# Patient Record
Sex: Female | Born: 1970 | Race: White | Hispanic: No | Marital: Married | Smoking: Never smoker
Health system: Southern US, Community
[De-identification: ages and names within clinical notes are randomized; demographics above are authoritative.]

## PROBLEM LIST (undated history)

## (undated) DIAGNOSIS — E079 Disorder of thyroid, unspecified: Secondary | ICD-10-CM

## (undated) DIAGNOSIS — E119 Type 2 diabetes mellitus without complications: Secondary | ICD-10-CM

## (undated) DIAGNOSIS — I10 Essential (primary) hypertension: Secondary | ICD-10-CM

## (undated) HISTORY — PX: NO PAST SURGERIES: SHX2092

---

## 2005-03-23 ENCOUNTER — Emergency Department (HOSPITAL_COMMUNITY): Admission: EM | Admit: 2005-03-23 | Discharge: 2005-03-23 | Payer: Self-pay | Admitting: Emergency Medicine

## 2013-01-17 ENCOUNTER — Other Ambulatory Visit (HOSPITAL_COMMUNITY): Payer: Self-pay | Admitting: Nurse Practitioner

## 2013-01-17 DIAGNOSIS — Z139 Encounter for screening, unspecified: Secondary | ICD-10-CM

## 2013-01-31 ENCOUNTER — Ambulatory Visit (HOSPITAL_COMMUNITY): Payer: Self-pay

## 2013-02-07 ENCOUNTER — Ambulatory Visit (HOSPITAL_COMMUNITY)
Admission: RE | Admit: 2013-02-07 | Discharge: 2013-02-07 | Disposition: A | Discharge status: Home / Self Care | Payer: Self-pay | Source: Ambulatory Visit | Attending: Nurse Practitioner | Admitting: Nurse Practitioner

## 2013-02-07 DIAGNOSIS — Z139 Encounter for screening, unspecified: Secondary | ICD-10-CM

## 2014-03-10 ENCOUNTER — Other Ambulatory Visit (HOSPITAL_COMMUNITY): Payer: Self-pay | Admitting: *Deleted

## 2014-03-10 DIAGNOSIS — Z1231 Encounter for screening mammogram for malignant neoplasm of breast: Secondary | ICD-10-CM

## 2014-04-17 ENCOUNTER — Ambulatory Visit (HOSPITAL_COMMUNITY): Payer: Self-pay

## 2014-05-01 ENCOUNTER — Ambulatory Visit (HOSPITAL_COMMUNITY)
Admission: RE | Admit: 2014-05-01 | Discharge: 2014-05-01 | Disposition: A | Discharge status: Home / Self Care | Payer: Self-pay | Source: Ambulatory Visit | Attending: *Deleted | Admitting: *Deleted

## 2014-05-01 DIAGNOSIS — Z1231 Encounter for screening mammogram for malignant neoplasm of breast: Secondary | ICD-10-CM

## 2015-03-28 ENCOUNTER — Other Ambulatory Visit (HOSPITAL_COMMUNITY): Payer: Self-pay | Admitting: *Deleted

## 2015-03-28 DIAGNOSIS — Z1231 Encounter for screening mammogram for malignant neoplasm of breast: Secondary | ICD-10-CM

## 2015-05-07 ENCOUNTER — Ambulatory Visit (HOSPITAL_COMMUNITY)
Admission: RE | Admit: 2015-05-07 | Discharge: 2015-05-07 | Disposition: A | Discharge status: Home / Self Care | Payer: BLUE CROSS/BLUE SHIELD | Source: Ambulatory Visit | Attending: *Deleted | Admitting: *Deleted

## 2015-05-07 DIAGNOSIS — Z1231 Encounter for screening mammogram for malignant neoplasm of breast: Secondary | ICD-10-CM

## 2016-04-02 ENCOUNTER — Other Ambulatory Visit (HOSPITAL_COMMUNITY): Payer: Self-pay | Admitting: Family

## 2016-04-02 DIAGNOSIS — Z1231 Encounter for screening mammogram for malignant neoplasm of breast: Secondary | ICD-10-CM

## 2016-05-12 ENCOUNTER — Ambulatory Visit (HOSPITAL_COMMUNITY)
Admission: RE | Admit: 2016-05-12 | Discharge: 2016-05-12 | Disposition: A | Discharge status: Home / Self Care | Payer: BLUE CROSS/BLUE SHIELD | Source: Ambulatory Visit | Attending: Family | Admitting: Family

## 2016-05-12 DIAGNOSIS — Z1231 Encounter for screening mammogram for malignant neoplasm of breast: Secondary | ICD-10-CM | POA: Diagnosis present

## 2017-04-01 ENCOUNTER — Other Ambulatory Visit (HOSPITAL_COMMUNITY): Payer: Self-pay | Admitting: *Deleted

## 2017-04-01 DIAGNOSIS — Z1231 Encounter for screening mammogram for malignant neoplasm of breast: Secondary | ICD-10-CM

## 2017-05-18 ENCOUNTER — Ambulatory Visit (HOSPITAL_COMMUNITY)
Admission: RE | Admit: 2017-05-18 | Discharge: 2017-05-18 | Disposition: A | Discharge status: Home / Self Care | Payer: BLUE CROSS/BLUE SHIELD | Source: Ambulatory Visit | Attending: *Deleted | Admitting: *Deleted

## 2017-05-18 DIAGNOSIS — Z1231 Encounter for screening mammogram for malignant neoplasm of breast: Secondary | ICD-10-CM | POA: Insufficient documentation

## 2018-04-08 ENCOUNTER — Other Ambulatory Visit (HOSPITAL_COMMUNITY): Payer: Self-pay | Admitting: *Deleted

## 2018-04-08 DIAGNOSIS — Z1231 Encounter for screening mammogram for malignant neoplasm of breast: Secondary | ICD-10-CM

## 2018-05-24 ENCOUNTER — Ambulatory Visit (HOSPITAL_COMMUNITY): Payer: BLUE CROSS/BLUE SHIELD

## 2018-05-31 ENCOUNTER — Ambulatory Visit (HOSPITAL_COMMUNITY)
Admission: RE | Admit: 2018-05-31 | Discharge: 2018-05-31 | Disposition: A | Discharge status: Home / Self Care | Payer: BLUE CROSS/BLUE SHIELD | Source: Ambulatory Visit | Attending: *Deleted | Admitting: *Deleted

## 2018-05-31 DIAGNOSIS — Z1231 Encounter for screening mammogram for malignant neoplasm of breast: Secondary | ICD-10-CM | POA: Diagnosis present

## 2019-04-21 ENCOUNTER — Other Ambulatory Visit (HOSPITAL_COMMUNITY): Payer: Self-pay | Admitting: *Deleted

## 2019-04-21 DIAGNOSIS — Z1231 Encounter for screening mammogram for malignant neoplasm of breast: Secondary | ICD-10-CM

## 2019-06-06 ENCOUNTER — Ambulatory Visit (HOSPITAL_COMMUNITY): Payer: BLUE CROSS/BLUE SHIELD

## 2021-02-13 ENCOUNTER — Other Ambulatory Visit: Payer: Self-pay

## 2021-02-13 DIAGNOSIS — Y9389 Activity, other specified: Secondary | ICD-10-CM | POA: Insufficient documentation

## 2021-02-13 DIAGNOSIS — W010XXA Fall on same level from slipping, tripping and stumbling without subsequent striking against object, initial encounter: Secondary | ICD-10-CM | POA: Insufficient documentation

## 2021-02-13 DIAGNOSIS — Y9289 Other specified places as the place of occurrence of the external cause: Secondary | ICD-10-CM | POA: Diagnosis not present

## 2021-02-13 DIAGNOSIS — Y999 Unspecified external cause status: Secondary | ICD-10-CM | POA: Diagnosis not present

## 2021-02-13 DIAGNOSIS — S8261XA Displaced fracture of lateral malleolus of right fibula, initial encounter for closed fracture: Secondary | ICD-10-CM | POA: Diagnosis not present

## 2021-02-13 DIAGNOSIS — Z7984 Long term (current) use of oral hypoglycemic drugs: Secondary | ICD-10-CM | POA: Diagnosis not present

## 2021-02-13 DIAGNOSIS — I10 Essential (primary) hypertension: Secondary | ICD-10-CM | POA: Insufficient documentation

## 2021-02-13 DIAGNOSIS — E119 Type 2 diabetes mellitus without complications: Secondary | ICD-10-CM | POA: Insufficient documentation

## 2021-02-13 DIAGNOSIS — S99911A Unspecified injury of right ankle, initial encounter: Secondary | ICD-10-CM | POA: Diagnosis present

## 2021-02-14 ENCOUNTER — Encounter (HOSPITAL_COMMUNITY): Payer: Self-pay | Admitting: Emergency Medicine

## 2021-02-14 ENCOUNTER — Emergency Department (HOSPITAL_COMMUNITY)
Admission: EM | Admit: 2021-02-14 | Discharge: 2021-02-14 | Disposition: A | Discharge status: Home / Self Care | Payer: BLUE CROSS/BLUE SHIELD | Attending: Emergency Medicine | Admitting: Emergency Medicine

## 2021-02-14 ENCOUNTER — Emergency Department (HOSPITAL_COMMUNITY): Payer: BLUE CROSS/BLUE SHIELD

## 2021-02-14 ENCOUNTER — Other Ambulatory Visit: Payer: Self-pay

## 2021-02-14 DIAGNOSIS — T148XXA Other injury of unspecified body region, initial encounter: Secondary | ICD-10-CM

## 2021-02-14 DIAGNOSIS — S82831A Other fracture of upper and lower end of right fibula, initial encounter for closed fracture: Secondary | ICD-10-CM

## 2021-02-14 HISTORY — DX: Disorder of thyroid, unspecified: E07.9

## 2021-02-14 HISTORY — DX: Type 2 diabetes mellitus without complications: E11.9

## 2021-02-14 HISTORY — DX: Essential (primary) hypertension: I10

## 2021-02-14 MED ORDER — KETAMINE HCL 10 MG/ML IJ SOLN
0.5000 mg/kg | Freq: Once | INTRAMUSCULAR | Status: AC
  Administered 2021-02-14: 55 mg via INTRAVENOUS
  Filled 2021-02-14: qty 1

## 2021-02-14 MED ORDER — HYDROCODONE-ACETAMINOPHEN 5-325 MG PO TABS: 1.0000 | ORAL_TABLET | ORAL | 0 refills | Status: DC | PRN

## 2021-02-14 MED ORDER — PROPOFOL 10 MG/ML IV BOLUS
0.5000 mg/kg | Freq: Once | INTRAVENOUS | Status: AC
  Administered 2021-02-14: 54.7 mg via INTRAVENOUS
  Filled 2021-02-14: qty 20

## 2021-02-14 NOTE — ED Triage Notes (Signed)
Pt fell while walking dog and now c/o right ankle/lower leg pain.

## 2021-02-14 NOTE — ED Provider Notes (Signed)
[facility] EMERGENCY DEPARTMENT Provider Note   CSN: [id number] Arrival date & time: 02/13/21  2334     History Chief Complaint  Patient presents with  . Ankle Pain    Tara Santiago is a 50 y.o. female.  Patient presents to the emergency department for evaluation of ankle injury.  Patient reports that she slipped in the mud and twisted her ankle, causing her to fall.  Patient complaining of severe left ankle pain since the fall, cannot bear any weight.  No other injury.        Past Medical History:  Diagnosis Date  . Diabetes mellitus without complication ([place])   . Hypertension   . Thyroid disease     There are no problems to display for this patient.   History reviewed. No pertinent surgical history.   OB History   No obstetric history on file.     No family history on file.  Social History   Tobacco Use  . Smoking status: Never Smoker  . Smokeless tobacco: Never Used  Substance Use Topics  . Alcohol use: Never  . Drug use: Never    Home Medications Prior to Admission medications   Not on File    Allergies    Peanut-containing drug products  Review of Systems   Review of Systems  Musculoskeletal: Positive for arthralgias.  Skin: Positive for color change.    Physical Exam Updated Vital Signs BP (!) 147/84   Pulse 96   Temp 98.8 F (37.1 C)   Resp 20   Ht 5' 7"  (1.702 m)   Wt 109.3 kg   LMP 01/15/2021   SpO2 100%   BMI 37.75 kg/m   Physical Exam Vitals and nursing note reviewed.  Constitutional:      Appearance: Normal appearance.  HENT:     Head: Atraumatic.  Musculoskeletal:     Left ankle: Swelling and ecchymosis present. No lacerations. Tenderness present over the medial malleolus. No proximal fibula tenderness.     Left foot: No swelling or deformity.  Skin:    Findings: Bruising present.  Neurological:     Mental Status: She is alert.     Cranial Nerves: Cranial nerves are intact.     Sensory: Sensation is intact.      Motor: Motor function is intact.     ED Results / Procedures / Treatments   Labs (all labs ordered are listed, but only abnormal results are displayed) Labs Reviewed - No data to display  EKG None  Radiology DG Tibia/Fibula Right  Result Date: 02/14/2021 CLINICAL DATA:  [place], right lower leg pain EXAM: RIGHT TIBIA AND FIBULA - 2 VIEW; RIGHT ANKLE - COMPLETE 3+ VIEW COMPARISON:  None. FINDINGS: Right tibia/fibula: Frontal and lateral views are obtained. There is a displaced oblique fracture through the distal fibular diaphysis. The remainder of the right tibia and fibula are unremarkable. There is lateral dislocation at the tibiotalar joint. Right knee is well aligned, with moderate medial and lateral compartmental osteoarthritis. Diffuse soft tissue edema. Right ankle: Frontal, oblique, lateral views demonstrate a displaced oblique distal fibular fracture. On the oblique projection, there is a small ossific density medial to the talus, likely a small avulsion fracture from the medial malleolus or medial aspect of the talus. There is lateral dislocation of the tibiotalar joint. Diffuse soft tissue edema. IMPRESSION: 1. Oblique distal fibular fracture. 2. Small avulsion fracture off the medial malleolus or medial margin of the talus. 3. Lateral dislocation of the tibiotalar joint.  4. Diffuse soft tissue edema right lower leg. Electronically Signed   By: Randa Ngo M.D.   On: 02/14/2021 01:07   DG Ankle Complete Right  Result Date: 02/14/2021 CLINICAL DATA:  [place], right lower leg pain EXAM: RIGHT TIBIA AND FIBULA - 2 VIEW; RIGHT ANKLE - COMPLETE 3+ VIEW COMPARISON:  None. FINDINGS: Right tibia/fibula: Frontal and lateral views are obtained. There is a displaced oblique fracture through the distal fibular diaphysis. The remainder of the right tibia and fibula are unremarkable. There is lateral dislocation at the tibiotalar joint. Right knee is well aligned, with moderate medial and lateral  compartmental osteoarthritis. Diffuse soft tissue edema. Right ankle: Frontal, oblique, lateral views demonstrate a displaced oblique distal fibular fracture. On the oblique projection, there is a small ossific density medial to the talus, likely a small avulsion fracture from the medial malleolus or medial aspect of the talus. There is lateral dislocation of the tibiotalar joint. Diffuse soft tissue edema. IMPRESSION: 1. Oblique distal fibular fracture. 2. Small avulsion fracture off the medial malleolus or medial margin of the talus. 3. Lateral dislocation of the tibiotalar joint. 4. Diffuse soft tissue edema right lower leg. Electronically Signed   By: Randa Ngo M.D.   On: 02/14/2021 01:07    Procedures .Ortho Injury Treatment  Date/Time: 02/14/2021 1:48 AM Performed by: Orpah Greek, MD Authorized by: Orpah Greek, MD   Consent:    Consent obtained:  Written   Consent given by:  Patient   Risks discussed:  Nerve damage, restricted joint movement, recurrent dislocation and irreducible dislocation Universal protocol:    Procedure explained and questions answered to patient or proxy's satisfaction: yes     Relevant documents present and verified: yes     Test results available and properly labeled: yes     Imaging studies available: yes     Required blood products, implants, devices, and special equipment available: yes     Site/side marked: yes     Immediately prior to procedure a time out was called: yes     Patient identity confirmed:  Verbally with patientInjury location: ankle Injury type: fracture-dislocation Fracture type: lateral malleolus Pre-procedure neurovascular assessment: neurovascularly intact Pre-procedure distal perfusion: normal Pre-procedure neurological function: normal Pre-procedure range of motion: reduced  Anesthesia: Local anesthesia used: no  Patient sedated: Yes. Refer to sedation procedure documentation for details of  sedation. Manipulation performed: yes Skeletal traction used: yes Reduction successful: yes X-ray confirmed reduction: yes Immobilization: splint Splint type: short leg and ankle stirrup Splint Applied by: ED Provider Supplies used: Ortho-Glass Post-procedure neurovascular assessment: post-procedure neurovascularly intact Post-procedure distal perfusion: normal Post-procedure neurological function: normal Post-procedure range of motion: unchanged  .Sedation  Date/Time: 02/14/2021 1:49 AM Performed by: Orpah Greek, MD Authorized by: Orpah Greek, MD   Consent:    Consent obtained:  Written   Consent given by:  Patient   Risks discussed:  Prolonged hypoxia resulting in organ damage, inadequate sedation, respiratory compromise necessitating ventilatory assistance and intubation, nausea and vomiting Universal protocol:    Procedure explained and questions answered to patient or proxy's satisfaction: yes     Relevant documents present and verified: yes     Test results available: yes     Imaging studies available: yes     Required blood products, implants, devices, and special equipment available: yes     Site/side marked: yes     Immediately prior to procedure, a time out was called: yes     Patient  identity confirmed:  Verbally with patient Indications:    Procedure performed:  Fracture reduction   Procedure necessitating sedation performed by:  Physician performing sedation Pre-sedation assessment:    Time since last food or drink:  8   ASA classification: class 2 - patient with mild systemic disease     Mouth opening:  3 or more finger widths   Mallampati score:  I - soft palate, uvula, fauces, pillars visible   Neck mobility: normal     Pre-sedation assessments completed and reviewed: airway patency, cardiovascular function, hydration status, mental status, nausea/vomiting, pain level, respiratory function and temperature   Immediate pre-procedure  details:    Reassessment: Patient reassessed immediately prior to procedure     Reviewed: vital signs and NPO status     Verified: bag valve mask available, emergency equipment available, intubation equipment available, IV patency confirmed and oxygen available   Procedure details (see MAR for exact dosages):    Preoxygenation:  Nasal cannula   Sedation:  Ketamine and propofol   Intended level of sedation: deep   Intra-procedure monitoring:  Blood pressure monitoring, continuous pulse oximetry, frequent LOC assessments, frequent vital sign checks and cardiac monitor   Intra-procedure events: none     Total Provider sedation time (minutes):  15 Post-procedure details:    Attendance: Constant attendance by certified staff until patient recovered     Recovery: Patient returned to pre-procedure baseline     Post-sedation assessments completed and reviewed: airway patency, cardiovascular function, hydration status, mental status, nausea/vomiting, pain level and respiratory function     Procedure completion:  Tolerated well, no immediate complications      SPLINT APPLICATION Date/Time: 7:11 AM Authorized by: Orpah Greek Consent: Verbal consent obtained. Risks and benefits: risks, benefits and alternatives were discussed Consent given by: patient Splint applied by: myself Location details: right leg  Splint type: short leg, stirrup Supplies used: orthoglass Post-procedure: The splinted body part was neurovascularly unchanged following the procedure. Patient tolerance: Patient tolerated the procedure well with no immediate complications.    Medications Ordered in ED Medications  propofol (DIPRIVAN) 10 mg/mL bolus/IV push 54.7 mg (54.7 mg Intravenous Given 02/14/21 0141)  ketamine (KETALAR) injection 55 mg (55 mg Intravenous Given 02/14/21 0140)    ED Course  I have reviewed the triage vital signs and the nursing notes.  Pertinent labs & imaging results that were available  during my care of the patient were reviewed by me and considered in my medical decision making (see chart for details).    MDM Rules/Calculators/A&P                          Patient presents with isolated ankle injury after slipping in mud and falling, twisting her ankle.  X-ray shows distal fibula fracture with lateral dislocation of the talus.  Findings discussed with patient, recommended closed reduction immediately in the emergency department which she did consent to.  This was performed.  Patient in a splint, will be nonweightbearing status.  Follow-up with orthopedics for surgical repair.  Final Clinical Impression(s) / ED Diagnoses Final diagnoses:  Fracture  Closed fracture of distal end of right fibula, unspecified fracture morphology, initial encounter    Rx / DC Orders ED Discharge Orders    None       Orpah Greek, MD 02/14/21 [phone]

## 2021-02-14 NOTE — ED Notes (Signed)
Patient transported to X-ray 

## 2021-02-14 NOTE — ED Notes (Signed)
Pt is awake and alert x4. Will continue to monitor pt

## 2021-02-14 NOTE — Sedation Documentation (Addendum)
Time out at [place]

## 2021-02-15 ENCOUNTER — Telehealth: Payer: Self-pay | Admitting: Orthopedic Surgery

## 2021-02-15 ENCOUNTER — Ambulatory Visit: Payer: BLUE CROSS/BLUE SHIELD | Admitting: Orthopedic Surgery

## 2021-02-15 ENCOUNTER — Encounter: Payer: Self-pay | Admitting: Orthopedic Surgery

## 2021-02-15 VITALS — BP 132/92 | HR 100 | Ht 67.0 in | Wt 241.0 lb

## 2021-02-15 DIAGNOSIS — Y93K1 Activity, walking an animal: Secondary | ICD-10-CM

## 2021-02-15 DIAGNOSIS — W19XXXA Unspecified fall, initial encounter: Secondary | ICD-10-CM | POA: Diagnosis not present

## 2021-02-15 DIAGNOSIS — S82891A Other fracture of right lower leg, initial encounter for closed fracture: Secondary | ICD-10-CM | POA: Diagnosis not present

## 2021-02-15 NOTE — H&P (View-Only) (Signed)
New Patient Visit  Assessment: Tara Santiago is a 50 y.o. female with the following: Right ankle fracture dislocation  Plan: Tara Santiago sustained a right ankle fracture, bimalleolar equivalent with distal fibula fracture and lateral displacement of the talus, indicative of deltoid ligament disruption.  She will benefit from operative fixation to include ORIF of the distal fibula fracture with intraoperative assessment of the syndesmosis.  We may also proceed with a tightrope device for the syndesmosis.  Risks and benefits of surgery, including, but not limited to infection, bleeding, persistent pain, damage to surrounding structures, need for further surgery, malunion, nonunion and more severe complications associated with anesthesia were discussed.  All questions have been answered and they have elected to proceed with surgery.   In preparation for surgery, I have advised her to elevate the operative extremity as much as possible in order to improve swelling.  There is a small chance that the swelling might be too severe at the time of the planned procedure.  If so, the ankle will be reduced in the OR and another splint will be placed.  She stated her understanding.  She has diabetes, which is well controlled, per her report.  She denies neuropathy prior to her injury.  She is aware that she is at an increased risk of delayed healing.  We may resort to prolonged immobilization as a result.   Surgical Plan  Procedure:  Operative fixation of right ankle fracture  Disposition: Ambulatory Anesthesia: Block + sedation; may need paralysis Medical Comorbidities: Diabetes (medications + insulin, well controlled), obesity, HTN Notes: Lateral plate and screws, +/- tightrope device   Follow-up: Return for After surgery; DOS 3/24.  Subjective:  Chief Complaint  Patient presents with  . Ankle Pain    Right ankle.     History of Present Illness: Tara Santiago is a 50 y.o. female who  presents for evaluation of right ankle pain.  She was walking her dog 2 days ago when she fell and twisted her ankle in the mud.  She noted immediate pain and deformity in her ankle.  She presented to the ED where she was diagnosed with an ankle fracture that was closed reduced under conscious sedation.  Since then, she has done well.  Her pain is controlled.  She has been trying to keep her leg elevated.  She has been taking hydrocodone for pain.  She struggles with crutches but her husband has a walker that she can use.  No numbness or tingling.  She has occasional sharp pains, she reports this is more like a burning sensation.  No pain elsewhere.    Review of Systems: No fevers or chills No numbness or tingling No chest pain No shortness of breath No bowel or bladder dysfunction No GI distress No headaches   Medical History:  Past Medical History:  Diagnosis Date  . Diabetes mellitus without complication ([place])   . Hypertension   . Thyroid disease     History reviewed. No pertinent surgical history.  History reviewed. No pertinent family history. Social History   Tobacco Use  . Smoking status: Never Smoker  . Smokeless tobacco: Never Used  Substance Use Topics  . Alcohol use: Never  . Drug use: Never    Allergies  Allergen Reactions  . Peanut-Containing Drug Products Hives    Patient reports dizzy and passes out.    Current Meds  Medication Sig  . APPLE CIDER VINEGAR PO Take 1 tablet by mouth daily.  Marland Kitchen aspirin  81 MG chewable tablet Chew 81 mg by mouth daily.  . Cholecalciferol (VITAMIN D) 125 MCG (5000 UT) CAPS Take 1 tablet by mouth daily.  Marland Kitchen glipiZIDE (GLUCOTROL) 10 MG tablet Take 20 mg by mouth 2 (two) times daily.  Marland Kitchen HYDROcodone-acetaminophen (NORCO/VICODIN) 5-325 MG tablet Take 1-2 tablets by mouth every 4 (four) hours as needed.  Marland Kitchen levothyroxine (SYNTHROID) 25 MCG tablet Take 25 mcg by mouth daily.  Marland Kitchen lisinopril (ZESTRIL) 2.5 MG tablet Take 2.5 mg by mouth  daily.  . metFORMIN (GLUCOPHAGE) 1000 MG tablet Take 1,000 mg by mouth 2 (two) times daily.  . methimazole (TAPAZOLE) 5 MG tablet Take 5 mg by mouth 2 (two) times daily.  . metoprolol tartrate (LOPRESSOR) 50 MG tablet Take 50 mg by mouth 2 (two) times daily.  . norethindrone (MICRONOR) 0.35 MG tablet Take 1 tablet by mouth daily.  Marland Kitchen NOVOLIN 70/30 RELION (70-30) 100 UNIT/ML injection SMARTSIG:35 Unit(s) SUB-Q Twice Daily  . oxybutynin (DITROPAN-XL) 10 MG 24 hr tablet Take 10 mg by mouth daily.    Objective: BP (!) 132/92   Pulse 100   Ht 5' 7"  (1.702 m)   Wt 241 lb (109.3 kg) Comment: Per patient  BMI 37.75 kg/m   Physical Exam:  General:  Alert and oriented, no acute distress.  Obese female, seated in a wheelchair.  Gait:  Unable to ambulate.  Right ankle in a short leg splint.  Splint is clean, dry and intact.  Sensation intact to exposed toes.  Able to wiggle toes.  Good capillary refill.  No skin breakdown around the splint.     IMAGING: I personally reviewed images previously obtained from the ED   Right ankle with distal fibula fracture and lateral displacement of the talus.  Interval reduction with improved alignment but persistent lateral displacement of the talus.    New Medications:  No orders of the defined types were placed in this encounter.     Mordecai Rasmussen, MD  02/15/2021 10:27 AM

## 2021-02-15 NOTE — Telephone Encounter (Signed)
I am waiting on the authorization for the surgery approval.

## 2021-02-15 NOTE — Progress Notes (Signed)
New Patient Visit  Assessment: Tara Santiago is a 50 y.o. female with the following: Right ankle fracture dislocation  Plan: [name] sustained a right ankle fracture, bimalleolar equivalent with distal fibula fracture and lateral displacement of the talus, indicative of deltoid ligament disruption.  She will benefit from operative fixation to include ORIF of the distal fibula fracture with intraoperative assessment of the syndesmosis.  We may also proceed with a tightrope device for the syndesmosis.  Risks and benefits of surgery, including, but not limited to infection, bleeding, persistent pain, damage to surrounding structures, need for further surgery, malunion, nonunion and more severe complications associated with anesthesia were discussed.  All questions have been answered and they have elected to proceed with surgery.   In preparation for surgery, I have advised her to elevate the operative extremity as much as possible in order to improve swelling.  There is a small chance that the swelling might be too severe at the time of the planned procedure.  If so, the ankle will be reduced in the OR and another splint will be placed.  She stated her understanding.  She has diabetes, which is well controlled, per her report.  She denies neuropathy prior to her injury.  She is aware that she is at an increased risk of delayed healing.  We may resort to prolonged immobilization as a result.   Surgical Plan  Procedure:  Operative fixation of right ankle fracture  Disposition: Ambulatory Anesthesia: Block + sedation; may need paralysis Medical Comorbidities: Diabetes (medications + insulin, well controlled), obesity, HTN Notes: Lateral plate and screws, +/- tightrope device   Follow-up: Return for After surgery; DOS 3/24.  Subjective:  Chief Complaint  Patient presents with  . Ankle Pain    Right ankle.     History of Present Illness: Tara Santiago is a 51 y.o. female who  presents for evaluation of right ankle pain.  She was walking her dog 2 days ago when she fell and twisted her ankle in the mud.  She noted immediate pain and deformity in her ankle.  She presented to the ED where she was diagnosed with an ankle fracture that was closed reduced under conscious sedation.  Since then, she has done well.  Her pain is controlled.  She has been trying to keep her leg elevated.  She has been taking hydrocodone for pain.  She struggles with crutches but her husband has a walker that she can use.  No numbness or tingling.  She has occasional sharp pains, she reports this is more like a burning sensation.  No pain elsewhere.    Review of Systems: No fevers or chills No numbness or tingling No chest pain No shortness of breath No bowel or bladder dysfunction No GI distress No headaches   Medical History:  Past Medical History:  Diagnosis Date  . Diabetes mellitus without complication ([place])   . Hypertension   . Thyroid disease     History reviewed. No pertinent surgical history.  History reviewed. No pertinent family history. Social History   Tobacco Use  . Smoking status: Never Smoker  . Smokeless tobacco: Never Used  Substance Use Topics  . Alcohol use: Never  . Drug use: Never    Allergies  Allergen Reactions  . Peanut-Containing Drug Products Hives    Patient reports dizzy and passes out.    Current Meds  Medication Sig  . APPLE CIDER VINEGAR PO Take 1 tablet by mouth daily.  Marland Kitchen aspirin  81 MG chewable tablet Chew 81 mg by mouth daily.  . Cholecalciferol (VITAMIN D) 125 MCG (5000 UT) CAPS Take 1 tablet by mouth daily.  Marland Kitchen glipiZIDE (GLUCOTROL) 10 MG tablet Take 20 mg by mouth 2 (two) times daily.  Marland Kitchen HYDROcodone-acetaminophen (NORCO/VICODIN) 5-325 MG tablet Take 1-2 tablets by mouth every 4 (four) hours as needed.  Marland Kitchen levothyroxine (SYNTHROID) 25 MCG tablet Take 25 mcg by mouth daily.  Marland Kitchen lisinopril (ZESTRIL) 2.5 MG tablet Take 2.5 mg by mouth  daily.  . metFORMIN (GLUCOPHAGE) 1000 MG tablet Take 1,000 mg by mouth 2 (two) times daily.  . methimazole (TAPAZOLE) 5 MG tablet Take 5 mg by mouth 2 (two) times daily.  . metoprolol tartrate (LOPRESSOR) 50 MG tablet Take 50 mg by mouth 2 (two) times daily.  . norethindrone (MICRONOR) 0.35 MG tablet Take 1 tablet by mouth daily.  Marland Kitchen NOVOLIN 70/30 RELION (70-30) 100 UNIT/ML injection SMARTSIG:35 Unit(s) SUB-Q Twice Daily  . oxybutynin (DITROPAN-XL) 10 MG 24 hr tablet Take 10 mg by mouth daily.    Objective: BP (!) 132/92   Pulse 100   Ht 5' 7"  (1.702 m)   Wt 241 lb (109.3 kg) Comment: Per patient  BMI 37.75 kg/m   Physical Exam:  General:  Alert and oriented, no acute distress.  Obese female, seated in a wheelchair.  Gait:  Unable to ambulate.  Right ankle in a short leg splint.  Splint is clean, dry and intact.  Sensation intact to exposed toes.  Able to wiggle toes.  Good capillary refill.  No skin breakdown around the splint.     IMAGING: I personally reviewed images previously obtained from the ED   Right ankle with distal fibula fracture and lateral displacement of the talus.  Interval reduction with improved alignment but persistent lateral displacement of the talus.    New Medications:  No orders of the defined types were placed in this encounter.     Mordecai Rasmussen, MD  02/15/2021 10:27 AM

## 2021-02-15 NOTE — Patient Instructions (Signed)
Elevate your leg as much as possible.   Surgery scheduled for 3/24

## 2021-02-19 ENCOUNTER — Ambulatory Visit: Payer: BLUE CROSS/BLUE SHIELD | Admitting: Orthopedic Surgery

## 2021-02-19 MED FILL — Hydrocodone-Acetaminophen Tab 5-325 MG: ORAL | Qty: 6 | Status: AC

## 2021-02-19 NOTE — Patient Instructions (Addendum)
Tara Santiago  02/19/2021     @PREFPERIOPPHARMACY @   Your procedure is scheduled on  02/21/2021.   Report to [facility] at  [place].M.   Call this number if you have problems the morning of surgery:  [phone]   Remember:  Do not eat or drink after midnight.                       Take these medicines the morning of surgery with A SIP OF WATER  Hydrocodone (if needed), levothyroxine, metoprolol, oxybutynin.  DO NOT take any medications for diabetes the morning of your procedure.  If your glucose is 70 or below the morning of your procedure, drink 1/2 cup of clear juice and recheck your glucose in 15 minutes. If your glucose is still 70 or below, call [phone] for instructions.  If your glucose is 300 or above the morning of your procedure, call [phone] for instructions.      Do not wear jewelry, make-up or nail polish.  Do not wear lotions, powders, or perfumes, or deodorant.  Do not shave 48 hours prior to surgery.  Men may shave face and neck.  Do not bring valuables to the hospital.  [facility] is not responsible for any belongings or valuables.  Contacts, dentures or bridgework may not be worn into surgery.  Leave your suitcase in the car.  After surgery it may be brought to your room.  For patients admitted to the hospital, discharge time will be determined by your treatment team.  Patients discharged the day of surgery will not be allowed to drive home and must have someone with them for 24 hours.  Place clean sheets on your bed the night before your procedure and DO NOT sleep with pets this night.  Shower with CHG the night before and the morning of your procedure. DO NOT put CHG on your face, hair or genitals.  After each shower, dry off with a clean towel, put on clean, comfortable clothes and brush your teeth.     Special instructions:   DO NOT smoke tobacco or vape the morning of your procedure.  Please read over the following  fact sheets that you were given. Coughing and Deep Breathing, Surgical Site Infection Prevention, Anesthesia Post-op Instructions and Care and Recovery After Surgery       Displaced Bimalleolar Ankle Fracture Treated With ORIF, Care After This sheet gives you information about how to care for yourself after your procedure. Your health care provider may also give you more specific instructions. If you have problems or questions, contact your health care provider. What can I expect after the procedure? After the procedure, it is common to have:  Pain.  Swelling.  A small amount of fluid from your incision. Follow these instructions at home: Medicines  Take over-the-counter and prescription medicines only as told by your health care provider.  Ask your health care provider if the medicine prescribed to you: ? Requires you to avoid driving or using machinery. ? Can cause constipation. You may need to take these actions to prevent or treat constipation:  Drink enough fluid to keep your urine pale yellow.  Take over-the-counter or prescription medicines.  Eat foods that are high in fiber, such as beans, whole grains, and fresh fruits and vegetables.  Limit foods that are high in fat and processed sugars, such as fried or sweet foods. If you have a splint  or boot:  Wear the splint or boot as told by your health care provider. Remove it only as told by your health care provider.  Loosen the splint or boot if your toes tingle, become numb, or turn cold and blue.  Keep the splint or boot clean. If you have a cast:  Do not stick anything inside the cast to scratch your skin. Doing that increases your risk of infection.  Check the skin around the cast every day. Tell your health care provider about any concerns.  You may put lotion on dry skin around the edges of the cast. Do not put lotion on the skin underneath the cast.  Keep the cast clean. Bathing  Do not take baths, swim,  or use a hot tub until your health care provider approves. Ask your health care provider if you may take showers. You may only be allowed to take sponge baths.  If your splint, boot, or cast is not waterproof: ? Do not let it get wet. ? Cover it with a watertight covering when you take a bath or a shower.  Keep the bandage (dressing) dry until your health care provider says it can be removed. Incision care  Follow instructions from your health care provider about how to take care of your incision. Make sure you: ? Wash your hands with soap and water for at least 20 seconds before and after you change your dressing. If soap and water are not available, use hand sanitizer. ? Change your dressing as told by your health care provider. ? Leave stitches (sutures), skin glue, or adhesive strips in place. These skin closures may need to stay in place for 2 weeks or longer. If adhesive strip edges start to loosen and curl up, you may trim the loose edges. Do not remove adhesive strips completely unless your health care provider tells you to do that.  Check your incision area every day for signs of infection. Check for: ? Redness. ? More pain or more swelling. ? Blood or more fluid. ? Warmth. ? Pus or a bad smell.   Managing pain, stiffness, and swelling  If directed, put ice on the affected area. To do this: ? If you have a removable splint or boot, remove it as told by your health care provider. ? Put ice in a plastic bag. ? Place a towel between your skin and the bag, or between your cast and the bag. ? Leave the ice on for 20 minutes, 2-3 times a day. ? Remove the ice if your skin turns bright red. This is very important. If you cannot feel pain, heat, or cold, you have a greater risk of damage to the area.  Move your toes often to reduce stiffness and swelling.  Raise (elevate) the injured area above the level of your heart while you are sitting or lying down. To do this, try putting a few  pillows under your leg and ankle.   Activity  Do not use your injured limb to support (bear) your body weight until your health care provider says that you can. Follow weight-bearing restrictions as told. Use crutches or other assistive devicesto help you move around as told by your health care provider.  Ask your health care provider when it is safe to drive if you have a splint, boot, or cast on your foot.  Do exercises as told by your health care provider or physical therapist.  Return to your normal activities as told by your health  care provider. Ask your health care provider what activities are safe for you. General instructions  Do not put pressure on any part of the splint or cast until it is fully hardened, if applicable. This may take several hours.  Do not use any products that contain nicotine or tobacco, such as cigarettes, e-cigarettes, and chewing tobacco. These can delay bone healing. If you need help quitting, ask your health care provider.  Keep all follow-up visits. This is important. Contact a health care provider if:  You have a fever.  Your pain medicine is not helping.  You have redness around your incision.  You have more swelling or severe pain around your incision.  You have more fluid or blood coming from your incision or leaking through your cast.  Your incision feels warm to the touch.  You have pus or a bad smell coming from your incision or from your cast or dressing. Get help right away if:  The edges of your incision come apart after the stitches or staples have been taken out.  You have chest pain.  You have trouble breathing.  Your foot or leg feels numb or tingles.  Your foot becomes cold, pale, or blue.  You have calf swelling or tenderness. These symptoms may represent a serious problem that is an emergency. Do not wait to see if the symptoms will go away. Get medical help right away. Call your local emergency services (911 in the  [place] Do not drive yourself to the hospital. Summary  After the procedure, it is common to have pain and swelling.  If your splint, boot, or cast is not waterproof, do not let it get wet.  Contact your health care provider if you have more swelling or severe pain, or if you have more fluids coming from your incision or leaking through your cast.  Get help right away if you have numbness or tingling in your foot or leg, or if your foot becomes cold, pale, or blue. This information is not intended to replace advice given to you by your health care provider. Make sure you discuss any questions you have with your health care provider. Document Revised: 02/20/2020 Document Reviewed: 02/20/2020 Elsevier Patient Education  2021 [place] Anesthesia, Adult, Care After This sheet gives you information about how to care for yourself after your procedure. Your health care provider may also give you more specific instructions. If you have problems or questions, contact your health care provider. What can I expect after the procedure? After the procedure, the following side effects are common:  Pain or discomfort at the IV site.  Nausea.  Vomiting.  Sore throat.  Trouble concentrating.  Feeling cold or chills.  Feeling weak or tired.  Sleepiness and fatigue.  Soreness and body aches. These side effects can affect parts of the body that were not involved in surgery. Follow these instructions at home: For the time period you were told by your health care provider:  Rest.  Do not participate in activities where you could fall or become injured.  Do not drive or use machinery.  Do not drink alcohol.  Do not take sleeping pills or medicines that cause drowsiness.  Do not make important decisions or sign legal documents.  Do not take care of children on your own.   Eating and drinking  Follow any instructions from your health care provider about eating or drinking  restrictions.  When you feel hungry, start by eating small amounts of  foods that are soft and easy to digest (bland), such as toast. Gradually return to your regular diet.  Drink enough fluid to keep your urine pale yellow.  If you vomit, rehydrate by drinking water, juice, or clear broth. General instructions  If you have sleep apnea, surgery and certain medicines can increase your risk for breathing problems. Follow instructions from your health care provider about wearing your sleep device: ? Anytime you are sleeping, including during daytime naps. ? While taking prescription pain medicines, sleeping medicines, or medicines that make you drowsy.  Have a responsible adult stay with you for the time you are told. It is important to have someone help care for you until you are awake and alert.  Return to your normal activities as told by your health care provider. Ask your health care provider what activities are safe for you.  Take over-the-counter and prescription medicines only as told by your health care provider.  If you smoke, do not smoke without supervision.  Keep all follow-up visits as told by your health care provider. This is important. Contact a health care provider if:  You have nausea or vomiting that does not get better with medicine.  You cannot eat or drink without vomiting.  You have pain that does not get better with medicine.  You are unable to pass urine.  You develop a skin rash.  You have a fever.  You have redness around your IV site that gets worse. Get help right away if:  You have difficulty breathing.  You have chest pain.  You have blood in your urine or stool, or you vomit blood. Summary  After the procedure, it is common to have a sore throat or nausea. It is also common to feel tired.  Have a responsible adult stay with you for the time you are told. It is important to have someone help care for you until you are awake and  alert.  When you feel hungry, start by eating small amounts of foods that are soft and easy to digest (bland), such as toast. Gradually return to your regular diet.  Drink enough fluid to keep your urine pale yellow.  Return to your normal activities as told by your health care provider. Ask your health care provider what activities are safe for you. This information is not intended to replace advice given to you by your health care provider. Make sure you discuss any questions you have with your health care provider. Document Revised: 08/02/2020 Document Reviewed: 03/01/2020 Elsevier Patient Education  2021 [employer].

## 2021-02-20 ENCOUNTER — Other Ambulatory Visit: Payer: Self-pay

## 2021-02-20 ENCOUNTER — Encounter (HOSPITAL_COMMUNITY): Payer: Self-pay

## 2021-02-20 ENCOUNTER — Other Ambulatory Visit (HOSPITAL_COMMUNITY)
Admission: RE | Admit: 2021-02-20 | Discharge: 2021-02-20 | Disposition: A | Discharge status: Home / Self Care | Payer: BLUE CROSS/BLUE SHIELD | Source: Ambulatory Visit | Attending: Orthopedic Surgery | Admitting: Orthopedic Surgery

## 2021-02-20 ENCOUNTER — Encounter (HOSPITAL_COMMUNITY)
Admission: RE | Admit: 2021-02-20 | Discharge: 2021-02-20 | Disposition: A | Discharge status: Home / Self Care | Payer: BLUE CROSS/BLUE SHIELD | Source: Ambulatory Visit | Attending: Orthopedic Surgery | Admitting: Orthopedic Surgery

## 2021-02-20 DIAGNOSIS — W010XXA Fall on same level from slipping, tripping and stumbling without subsequent striking against object, initial encounter: Secondary | ICD-10-CM | POA: Diagnosis not present

## 2021-02-20 DIAGNOSIS — Z20822 Contact with and (suspected) exposure to covid-19: Secondary | ICD-10-CM | POA: Insufficient documentation

## 2021-02-20 DIAGNOSIS — S82851A Displaced trimalleolar fracture of right lower leg, initial encounter for closed fracture: Secondary | ICD-10-CM | POA: Diagnosis not present

## 2021-02-20 DIAGNOSIS — Z79899 Other long term (current) drug therapy: Secondary | ICD-10-CM | POA: Diagnosis not present

## 2021-02-20 DIAGNOSIS — Z01818 Encounter for other preprocedural examination: Secondary | ICD-10-CM | POA: Insufficient documentation

## 2021-02-20 DIAGNOSIS — Z79890 Hormone replacement therapy (postmenopausal): Secondary | ICD-10-CM | POA: Diagnosis not present

## 2021-02-20 DIAGNOSIS — Z794 Long term (current) use of insulin: Secondary | ICD-10-CM | POA: Diagnosis not present

## 2021-02-20 DIAGNOSIS — E669 Obesity, unspecified: Secondary | ICD-10-CM | POA: Diagnosis not present

## 2021-02-20 DIAGNOSIS — Z6837 Body mass index (BMI) 37.0-37.9, adult: Secondary | ICD-10-CM | POA: Diagnosis not present

## 2021-02-20 DIAGNOSIS — Z7982 Long term (current) use of aspirin: Secondary | ICD-10-CM | POA: Diagnosis not present

## 2021-02-20 DIAGNOSIS — Z7984 Long term (current) use of oral hypoglycemic drugs: Secondary | ICD-10-CM | POA: Diagnosis not present

## 2021-02-20 LAB — BASIC METABOLIC PANEL
Anion gap: 11 (ref 5–15)
BUN: 12 mg/dL (ref 6–20)
CO2: 25 mmol/L (ref 22–32)
Calcium: 9.3 mg/dL (ref 8.9–10.3)
Chloride: 102 mmol/L (ref 98–111)
Creatinine, Ser: 0.58 mg/dL (ref 0.44–1.00)
GFR, Estimated: >60 mL/min (ref >60)
Glucose, Bld: 134 mg/dL — ABNORMAL HIGH (ref 70–99)
Potassium: 4.2 mmol/L (ref 3.5–5.1)
Sodium: 138 mmol/L (ref 135–145)

## 2021-02-20 LAB — CBC
HCT: 44.3 % (ref 36.0–46.0)
Hemoglobin: 14.3 g/dL (ref 12.0–15.0)
MCH: 29.2 pg (ref 26.0–34.0)
MCHC: 32.3 g/dL (ref 30.0–36.0)
MCV: 90.4 fL (ref 80.0–100.0)
Platelets: 411 10*3/uL — ABNORMAL HIGH (ref 150–400)
RBC: 4.9 MIL/uL (ref 3.87–5.11)
RDW: 13.7 % (ref 11.5–15.5)
WBC: 12.1 10*3/uL — ABNORMAL HIGH (ref 4.0–10.5)
nRBC: 0 % (ref 0.0–0.2)

## 2021-02-20 LAB — HEMOGLOBIN A1C
Hgb A1c MFr Bld: 6.7 % — ABNORMAL HIGH (ref 4.8–5.6)
Mean Plasma Glucose: 145.59 mg/dL

## 2021-02-20 LAB — HCG, SERUM, QUALITATIVE: Preg, Serum: NEGATIVE

## 2021-02-20 LAB — SARS CORONAVIRUS 2 (TAT 6-24 HRS): SARS Coronavirus 2: NEGATIVE

## 2021-02-20 NOTE — Telephone Encounter (Signed)
Tara Santiago #[id number], per voice message received Friday, 02/15/21, 4:31pm from  [place], [name] Pre-Certification to relay that no pre-authorization is required, regarding  Case# [id number] for CPT code [id number].

## 2021-02-20 NOTE — Telephone Encounter (Signed)
No pre-certifcation is needed.

## 2021-02-21 ENCOUNTER — Ambulatory Visit (HOSPITAL_COMMUNITY): Payer: BLUE CROSS/BLUE SHIELD

## 2021-02-21 ENCOUNTER — Ambulatory Visit (HOSPITAL_COMMUNITY): Payer: BLUE CROSS/BLUE SHIELD | Admitting: Certified Registered"

## 2021-02-21 ENCOUNTER — Encounter (HOSPITAL_COMMUNITY)
Admission: RE | Disposition: A | Discharge status: Home / Self Care | Payer: Self-pay | Source: Home / Self Care | Attending: Orthopedic Surgery

## 2021-02-21 ENCOUNTER — Ambulatory Visit (HOSPITAL_COMMUNITY)
Admission: RE | Admit: 2021-02-21 | Discharge: 2021-02-21 | Disposition: A | Discharge status: Home / Self Care | Payer: BLUE CROSS/BLUE SHIELD | Attending: Orthopedic Surgery | Admitting: Orthopedic Surgery

## 2021-02-21 ENCOUNTER — Encounter (HOSPITAL_COMMUNITY): Payer: Self-pay | Admitting: Orthopedic Surgery

## 2021-02-21 DIAGNOSIS — S82891A Other fracture of right lower leg, initial encounter for closed fracture: Secondary | ICD-10-CM

## 2021-02-21 DIAGNOSIS — Z20822 Contact with and (suspected) exposure to covid-19: Secondary | ICD-10-CM | POA: Insufficient documentation

## 2021-02-21 DIAGNOSIS — W010XXA Fall on same level from slipping, tripping and stumbling without subsequent striking against object, initial encounter: Secondary | ICD-10-CM | POA: Insufficient documentation

## 2021-02-21 DIAGNOSIS — Z79890 Hormone replacement therapy (postmenopausal): Secondary | ICD-10-CM | POA: Insufficient documentation

## 2021-02-21 DIAGNOSIS — Z9889 Other specified postprocedural states: Secondary | ICD-10-CM

## 2021-02-21 DIAGNOSIS — S82851A Displaced trimalleolar fracture of right lower leg, initial encounter for closed fracture: Secondary | ICD-10-CM | POA: Insufficient documentation

## 2021-02-21 DIAGNOSIS — E669 Obesity, unspecified: Secondary | ICD-10-CM | POA: Insufficient documentation

## 2021-02-21 DIAGNOSIS — Z6837 Body mass index (BMI) 37.0-37.9, adult: Secondary | ICD-10-CM | POA: Insufficient documentation

## 2021-02-21 DIAGNOSIS — Z7982 Long term (current) use of aspirin: Secondary | ICD-10-CM | POA: Insufficient documentation

## 2021-02-21 DIAGNOSIS — Z79899 Other long term (current) drug therapy: Secondary | ICD-10-CM | POA: Insufficient documentation

## 2021-02-21 DIAGNOSIS — Z794 Long term (current) use of insulin: Secondary | ICD-10-CM | POA: Insufficient documentation

## 2021-02-21 DIAGNOSIS — Z7984 Long term (current) use of oral hypoglycemic drugs: Secondary | ICD-10-CM | POA: Insufficient documentation

## 2021-02-21 HISTORY — PX: ORIF ANKLE FRACTURE: SHX5408

## 2021-02-21 LAB — GLUCOSE, CAPILLARY
Glucose-Capillary: 178 mg/dL — ABNORMAL HIGH (ref 70–99)
Glucose-Capillary: 175 mg/dL — ABNORMAL HIGH (ref 70–99)

## 2021-02-21 SURGERY — OPEN REDUCTION INTERNAL FIXATION (ORIF) ANKLE FRACTURE
Anesthesia: General | Site: Ankle | Laterality: Right

## 2021-02-21 MED ORDER — CEFAZOLIN SODIUM-DEXTROSE 2-4 GM/100ML-% IV SOLN
2.0000 g | INTRAVENOUS | Status: AC
  Administered 2021-02-21: 2 g via INTRAVENOUS

## 2021-02-21 MED ORDER — OXYCODONE HCL 5 MG PO TABS
5.0000 mg | ORAL_TABLET | ORAL | 0 refills | Status: AC | PRN
Start: 2021-02-21 — End: 2021-02-28

## 2021-02-21 MED ORDER — PROPOFOL 10 MG/ML IV BOLUS
INTRAVENOUS | Status: DC | PRN
  Administered 2021-02-21: 200 mg via INTRAVENOUS

## 2021-02-21 MED ORDER — LIDOCAINE 2% (20 MG/ML) 5 ML SYRINGE
INTRAMUSCULAR | Status: DC | PRN
  Administered 2021-02-21: 40 mg via INTRAVENOUS

## 2021-02-21 MED ORDER — ONDANSETRON HCL 4 MG/2ML IJ SOLN: 4.0000 mg | Freq: Once | INTRAMUSCULAR | Status: DC | PRN

## 2021-02-21 MED ORDER — ORAL CARE MOUTH RINSE: 15.0000 mL | Freq: Once | OROMUCOSAL | Status: AC

## 2021-02-21 MED ORDER — FENTANYL CITRATE (PF) 250 MCG/5ML IJ SOLN
INTRAMUSCULAR | Status: AC
  Filled 2021-02-21: qty 5

## 2021-02-21 MED ORDER — ONDANSETRON HCL 4 MG PO TABS: 4.0000 mg | ORAL_TABLET | Freq: Three times a day (TID) | ORAL | 0 refills | Status: AC | PRN

## 2021-02-21 MED ORDER — FENTANYL CITRATE (PF) 250 MCG/5ML IJ SOLN
INTRAMUSCULAR | Status: DC | PRN
  Administered 2021-02-21 (×10): 25 ug via INTRAVENOUS

## 2021-02-21 MED ORDER — CHLORHEXIDINE GLUCONATE 0.12 % MT SOLN
OROMUCOSAL | Status: AC
  Filled 2021-02-21: qty 15

## 2021-02-21 MED ORDER — MIDAZOLAM HCL 2 MG/2ML IJ SOLN
INTRAMUSCULAR | Status: AC
  Filled 2021-02-21: qty 2

## 2021-02-21 MED ORDER — BUPIVACAINE-EPINEPHRINE (PF) 0.5% -1:200000 IJ SOLN
INTRAMUSCULAR | Status: AC
  Filled 2021-02-21: qty 30

## 2021-02-21 MED ORDER — CHLORHEXIDINE GLUCONATE 0.12 % MT SOLN
15.0000 mL | Freq: Once | OROMUCOSAL | Status: AC
  Administered 2021-02-21: 15 mL via OROMUCOSAL

## 2021-02-21 MED ORDER — ACETAMINOPHEN 500 MG PO TABS: 1000.0000 mg | ORAL_TABLET | Freq: Three times a day (TID) | ORAL | 0 refills | Status: AC

## 2021-02-21 MED ORDER — 0.9 % SODIUM CHLORIDE (POUR BTL) OPTIME
TOPICAL | Status: DC | PRN
  Administered 2021-02-21: 1000 mL

## 2021-02-21 MED ORDER — HYDROMORPHONE HCL 1 MG/ML IJ SOLN: 0.2500 mg | INTRAMUSCULAR | Status: DC | PRN

## 2021-02-21 MED ORDER — CEFAZOLIN SODIUM-DEXTROSE 2-4 GM/100ML-% IV SOLN
INTRAVENOUS | Status: AC
  Filled 2021-02-21: qty 100

## 2021-02-21 MED ORDER — ASPIRIN EC 81 MG PO TBEC: 81.0000 mg | DELAYED_RELEASE_TABLET | Freq: Two times a day (BID) | ORAL | 0 refills | Status: AC

## 2021-02-21 MED ORDER — PROPOFOL 10 MG/ML IV BOLUS
INTRAVENOUS | Status: AC
  Filled 2021-02-21: qty 20

## 2021-02-21 MED ORDER — BUPIVACAINE-EPINEPHRINE (PF) 0.5% -1:200000 IJ SOLN
INTRAMUSCULAR | Status: DC | PRN
  Administered 2021-02-21: 30 mL via PERINEURAL

## 2021-02-21 MED ORDER — ONDANSETRON HCL 4 MG/2ML IJ SOLN
INTRAMUSCULAR | Status: DC | PRN
  Administered 2021-02-21: 4 mg via INTRAVENOUS

## 2021-02-21 MED ORDER — DEXAMETHASONE SODIUM PHOSPHATE 10 MG/ML IJ SOLN
INTRAMUSCULAR | Status: DC | PRN
  Administered 2021-02-21: 10 mg via INTRAVENOUS

## 2021-02-21 MED ORDER — MIDAZOLAM HCL 5 MG/5ML IJ SOLN
INTRAMUSCULAR | Status: DC | PRN
  Administered 2021-02-21: 2 mg via INTRAVENOUS

## 2021-02-21 MED ORDER — LACTATED RINGERS IV SOLN
INTRAVENOUS | Status: DC
  Administered 2021-02-21: 1000 mL via INTRAVENOUS

## 2021-02-21 MED ORDER — ONDANSETRON HCL 4 MG/2ML IJ SOLN
INTRAMUSCULAR | Status: AC
  Filled 2021-02-21: qty 2

## 2021-02-21 MED ORDER — CELECOXIB 100 MG PO CAPS: 100.0000 mg | ORAL_CAPSULE | Freq: Every day | ORAL | 0 refills | Status: AC

## 2021-02-21 MED ORDER — DEXAMETHASONE SODIUM PHOSPHATE 10 MG/ML IJ SOLN
INTRAMUSCULAR | Status: AC
  Filled 2021-02-21: qty 1

## 2021-02-21 SURGICAL SUPPLY — 74 items
APL PRP STRL LF DISP 70% ISPRP (MISCELLANEOUS) ×1
BANDAGE ELASTIC 4 VELCRO NS (GAUZE/BANDAGES/DRESSINGS) ×2 IMPLANT
BANDAGE ESMARK 4X12 BL STRL LF (DISPOSABLE) ×1 IMPLANT
BIT DRILL 2 CANN GRADUATED (BIT) ×1 IMPLANT
BIT DRILL 2.5 CANN LNG (BIT) ×1 IMPLANT
BLADE SURG SZ10 CARB STEEL (BLADE) ×2 IMPLANT
BNDG CMPR 12X4 ELC STRL LF (DISPOSABLE) ×1
BNDG CMPR STD VLCR NS LF 5.8X4 (GAUZE/BANDAGES/DRESSINGS) ×1
BNDG COHESIVE 4X5 TAN STRL (GAUZE/BANDAGES/DRESSINGS) ×2 IMPLANT
BNDG ELASTIC 4X5.8 VLCR NS LF (GAUZE/BANDAGES/DRESSINGS) ×2 IMPLANT
BNDG ESMARK 4X12 BLUE STRL LF (DISPOSABLE) ×2
CHLORAPREP W/TINT 26 (MISCELLANEOUS) ×2 IMPLANT
CLOTH BEACON ORANGE TIMEOUT ST (SAFETY) ×2 IMPLANT
COVER LIGHT HANDLE STERIS (MISCELLANEOUS) ×4 IMPLANT
COVER MAYO STAND XLG (MISCELLANEOUS) ×2 IMPLANT
COVER WAND RF STERILE (DRAPES) ×2 IMPLANT
CUFF TOURN SGL QUICK 34 (TOURNIQUET CUFF) ×2
CUFF TRNQT CYL 34X4.125X (TOURNIQUET CUFF) ×1 IMPLANT
DECANTER SPIKE VIAL GLASS SM (MISCELLANEOUS) ×1 IMPLANT
DRAPE C-ARM FOLDED MOBILE STRL (DRAPES) ×2 IMPLANT
DRAPE C-ARMOR (DRAPES) ×2 IMPLANT
DRAPE U-SHAPE 47X51 STRL (DRAPES) ×2 IMPLANT
DRSG XEROFORM 1X8 (GAUZE/BANDAGES/DRESSINGS) ×1 IMPLANT
ELECT REM PT RETURN 9FT ADLT (ELECTROSURGICAL) ×2
ELECTRODE REM PT RTRN 9FT ADLT (ELECTROSURGICAL) ×1 IMPLANT
GAUZE SPONGE 4X4 12PLY STRL (GAUZE/BANDAGES/DRESSINGS) ×2 IMPLANT
GAUZE XEROFORM 1X8 LF (GAUZE/BANDAGES/DRESSINGS) ×2 IMPLANT
GLOVE SKINSENSE NS SZ8.0 LF (GLOVE) ×2
GLOVE SKINSENSE STRL SZ8.0 LF (GLOVE) ×2 IMPLANT
GLOVE SRG 8 PF TXTR STRL LF DI (GLOVE) ×1 IMPLANT
GLOVE SURG UNDER POLY LF SZ7 (GLOVE) ×6 IMPLANT
GLOVE SURG UNDER POLY LF SZ8 (GLOVE) ×2
GOWN STRL REUS W/ TWL XL LVL3 (GOWN DISPOSABLE) ×1 IMPLANT
GOWN STRL REUS W/TWL LRG LVL3 (GOWN DISPOSABLE) ×4 IMPLANT
GOWN STRL REUS W/TWL XL LVL3 (GOWN DISPOSABLE) ×2
IMPL TIGHTROP W/DRV K-LESS (Anchor) IMPLANT
IMPLANT TIGHTROPE W/DRV K-LESS (Anchor) ×2 IMPLANT
INST SET MINOR BONE (KITS) ×2 IMPLANT
K-WIRE BB-TAK (WIRE) ×4
KIT TURNOVER KIT A (KITS) ×2 IMPLANT
KWIRE BB-TAK (WIRE) IMPLANT
MANIFOLD NEPTUNE II (INSTRUMENTS) ×2 IMPLANT
NDL HYPO 21X1.5 SAFETY (NEEDLE) IMPLANT
NEEDLE HYPO 21X1.5 SAFETY (NEEDLE) ×2 IMPLANT
NS IRRIG 1000ML POUR BTL (IV SOLUTION) ×2 IMPLANT
PACK BASIC LIMB (CUSTOM PROCEDURE TRAY) ×2 IMPLANT
PAD ABD 5X9 TENDERSORB (GAUZE/BANDAGES/DRESSINGS) ×3 IMPLANT
PAD ABD 8X10 STRL (GAUZE/BANDAGES/DRESSINGS) ×6 IMPLANT
PAD ARMBOARD 7.5X6 YLW CONV (MISCELLANEOUS) ×2 IMPLANT
PAD CAST 4YDX4 CTTN HI CHSV (CAST SUPPLIES) ×1 IMPLANT
PADDING CAST ABS 4INX4YD NS (CAST SUPPLIES) ×2
PADDING CAST ABS COTTON 4X4 ST (CAST SUPPLIES) ×2 IMPLANT
PADDING CAST COTTON 4X4 STRL (CAST SUPPLIES) ×2
PADDING WEBRIL 4 STERILE (GAUZE/BANDAGES/DRESSINGS) ×4 IMPLANT
PENCIL SMOKE EVACUATOR (MISCELLANEOUS) ×2 IMPLANT
PLATE FIBULA 6 HOLE RT LOCK (Plate) ×1 IMPLANT
SCREW LOCKING 2.7X10 ANKLE (Screw) ×1 IMPLANT
SCREW LOCKING 2.7X14MM (Screw) ×1 IMPLANT
SCREW LOCKING 2.7X16MM (Screw) ×2 IMPLANT
SCREW LOW PROFILE 3.5X14 (Screw) ×3 IMPLANT
SCREW NLOCK T15 FT 18X3.5XST (Screw) IMPLANT
SCREW NON LOCK 3.5X18MM (Screw) ×2 IMPLANT
SET BASIN LINEN APH (SET/KITS/TRAYS/PACK) ×2 IMPLANT
SPLINT PLASTER CAST XFAST 5X30 (CAST SUPPLIES) IMPLANT
SPLINT PLASTER XFAST SET 5X30 (CAST SUPPLIES)
SPONGE LAP 18X18 RF (DISPOSABLE) ×2 IMPLANT
STAPLER VISISTAT (STAPLE) ×1 IMPLANT
SUT ETHILON 3 0 FSL (SUTURE) IMPLANT
SUT MON AB 0 CT1 (SUTURE) ×1 IMPLANT
SUT MON AB 2-0 CT1 36 (SUTURE) ×1 IMPLANT
SUT VIC AB 2-0 CT1 27 (SUTURE) ×2
SUT VIC AB 2-0 CT1 TAPERPNT 27 (SUTURE) IMPLANT
SYR 30ML LL (SYRINGE) ×1 IMPLANT
SYR BULB IRRIG 60ML STRL (SYRINGE) ×4 IMPLANT

## 2021-02-21 NOTE — Anesthesia Preprocedure Evaluation (Signed)
Anesthesia Evaluation  Patient identified by MRN, date of birth, ID band Patient awake    Reviewed: Allergy & Precautions, H&P , NPO status , Patient's Chart, lab work & pertinent test results, reviewed documented beta blocker date and time   Airway Mallampati: II  TM Distance: >3 FB Neck ROM: full    Dental no notable dental hx.    Pulmonary neg pulmonary ROS,    Pulmonary exam normal breath sounds clear to auscultation       Cardiovascular Exercise Tolerance: Good hypertension, negative cardio ROS   Rhythm:regular Rate:Normal     Neuro/Psych negative neurological ROS  negative psych ROS   GI/Hepatic negative GI ROS, Neg liver ROS,   Endo/Other  diabetes, Well Controlled, Type 2Morbid obesity  Renal/GU negative Renal ROS  negative genitourinary   Musculoskeletal   Abdominal   Peds  Hematology negative hematology ROS (+)   Anesthesia Other Findings   Reproductive/Obstetrics negative OB ROS                             Anesthesia Physical Anesthesia Plan  ASA: III  Anesthesia Plan: General   Post-op Pain Management:    Induction:   PONV Risk Score and Plan: Ondansetron  Airway Management Planned:   Additional Equipment:   Intra-op Plan:   Post-operative Plan:   Informed Consent: I have reviewed the patients History and Physical, chart, labs and discussed the procedure including the risks, benefits and alternatives for the proposed anesthesia with the patient or authorized representative who has indicated his/her understanding and acceptance.     Dental Advisory Given  Plan Discussed with: CRNA  Anesthesia Plan Comments:         Anesthesia Quick Evaluation

## 2021-02-21 NOTE — Op Note (Signed)
Orthopaedic Surgery Operative Note (CSN: [id number])  Tara Santiago  December 17, 1970 Date of Surgery: 02/21/2021   Diagnoses:  Right trimalleolar ankle fracture  Procedure: Operative fixation of right trimalleolar ankle fracture    Operative Finding Successful completion of the planned procedure.  Placement of a distal fibula plate laterally with a single tightrope device across the syndesmosis.  Patient developed some small fracture blisters medially and there is a pressure area over the medial malleolus.  This was covered with some xeroform postop, prior to placing the splint.     Post-Op Diagnosis: Same Surgeons:Primary: Mordecai Rasmussen, MD Assistants: None Location: [facility] OR ROOM 4 Anesthesia: General with local anesthesia Antibiotics: Ancef 2 g Tourniquet time:  Total Tourniquet Time Documented: Thigh (Right) - 84 minutes Total: Thigh (Right) - 84 minutes  Estimated Blood Loss: Minimal Complications: None Specimens: None Implants: Implant Name Type Inv. Item Serial No. Manufacturer Lot No. LRB No. Used Action  PLATE FIBULA 6 HOLE RT LOCK - [id number] Plate PLATE FIBULA 6 HOLE RT LOCK  [employer] STERILE ON SET FROM SPD Right 1 Implanted  SCREW LOW PROFILE 3.5X14 - [id number] Screw SCREW LOW PROFILE 3.5X14  [employer] STERILE ON SET FROM SPD Right 3 Implanted  2.7 x 68m locking screw    [employer] STERILE ON SET FROM SPD Right 2 Implanted  2.7 x 144mlocking screw    [employer] STERILE ON SET FROM SPD Right 1 Implanted  2.7 mm x 1024mocking screw    [employer] STERILE ON SET FROM SPD Right 1 Implanted  IMPLANT TIGHTROPE W/DRV K-LESS - [id number] IMPLANT TIGHTROPE W/DRV K-LDory [id number] 1 Implanted    Indications for Surgery:   Tara Santiago a [age]o. female who sustained a right ankle fracture dislocation.  It was closed reduced in the ED, with improved alignment, but persistent subluxation.  Benefits and risks of operative and nonoperative  management were discussed prior to surgery with patient/guardian(s) and informed consent form was completed.  Specific risks including infection, need for additional surgery, bleeding, malunion, nonunion, stiffness, arthritis, persistent pain and more severe complications associated with anesthesia.  She elected to proceed.    Procedure:   The patient was identified properly. Informed consent was obtained and the surgical site was marked. The patient was taken up to suite where general anesthesia was induced.  The patient was positioned supine.  The right leg was prepped and draped in the usual sterile fashion.  Timeout was performed before the beginning of the case.  Tourniquet was used for the above duration.  We confirmed dosing of antibiotics prior to incision.  With the assistance of fluoroscopy, we planned a lateral based incision centered over the fracture site.  We incised sharply over the lateral aspect of the fibula with a scalpel.  We dissected bluntly to the level of the lateral fibula.  We then used a key elevator to remove soft tissue from the lateral aspect of the fibula, in preparation for placement of a plate.  The fracture site was readily identified.  We then prepared the distal fragment using a knife to expose the lateral aspect of the fibula.  The hematoma was removed from the fracture site using a [employer] scientistondure and irrigation.  Next, using multiple reduction clamps, we are able to anatomically reduce the fracture.  A reduction was confirmed on fluoroscopy.  We then selected an appropriately sized plate, and this was placed on the lateral aspect  of fibula.  This was provisionally secured to the fibula.  A single nonlocking screw was then placed in the shaft.  Adequate placement of the plate was confirmed on both AP and lateral imaging at this time.  We then proceeded to place multiple nonlocking screws to secure the plate to the fibula.  Next, multiple locking screws were  placed in the distal cluster.  The lengths were confirmed on fluoroscopy.  Once the plate was adequately secured to the lower aspect of the fibula, the ankle joint was stressed.  Under fluoroscopy, there continued to be medial clear space widening and some instability noted at the syndesmosis.  In addition, on the lateral x-rays, a very small posterior malleolus fragment was identified.  This was deemed too small to fix.  Because of the instability noted on x-ray, we plan to place a single tight rope across the syndesmosis.  Under direct visualization, a K wire was placed through the fibular plate, parallel to the joint line to the medial aspect of the distal tibia.  We then used the cannulated drill bit to drill across the fibula and the tibia.  The tight rope device was then inserted under direct visualization using fluoroscopy.  The button was flipped medially.  The second button was then secured to the plate on the fibula and sequentially tightened.  The lateral button was then secured with multiple half hitches.  Once again, the ankle was stressed and there was no identifiable syndesmotic instability, and minimal medial clear space widening.  Final orthogonal views under fluoroscopy were obtained, and we were satisfied with our reduction and the stability noted in the ankle.  We irrigated the wound copiously.  We closed the incision in a multilayer fashion with 2-0 Monocryl followed by 3-0 nylon.  We placed Xeroform over the lateral incision, as well as the medial pressure wound.  A sterile dressing was placed followed by a well-padded splint..  Patient was awoken taken to PACU in stable condition.  Post-operative plan:  The patient will be discharged from the PACU when she has adequately recovered. She will remain nonweightbearing on the right lower extremity..   DVT prophylaxis Aspirin 81 mg twice daily for 6 weeks.    Pain control with PRN pain medication preferring oral medicines.   Follow up  plan will be scheduled in approximately 10-14 days for incision check and XR.

## 2021-02-21 NOTE — Anesthesia Procedure Notes (Signed)
Procedure Name: LMA Insertion Date/Time: 02/21/2021 12:43 PM Performed by: Myna Bright, CRNA Pre-anesthesia Checklist: Patient identified, Emergency Drugs available, Suction available and Patient being monitored Patient Re-evaluated:Patient Re-evaluated prior to induction Oxygen Delivery Method: Circle system utilized Preoxygenation: Pre-oxygenation with 100% oxygen Induction Type: IV induction Ventilation: Mask ventilation without difficulty LMA: LMA inserted LMA Size: 4.0 Tube type: Oral Number of attempts: 1 Placement Confirmation: positive ETCO2 and breath sounds checked- equal and bilateral Tube secured with: Tape Dental Injury: Teeth and Oropharynx as per pre-operative assessment

## 2021-02-21 NOTE — Anesthesia Postprocedure Evaluation (Signed)
Anesthesia Post Note  Patient: Tara Santiago  Procedure(s) Performed: OPEN REDUCTION INTERNAL FIXATION (ORIF) ANKLE FRACTURE (Right Ankle)  Patient location during evaluation: PACU Anesthesia Type: General Level of consciousness: awake and alert and patient cooperative Pain management: pain level controlled Vital Signs Assessment: post-procedure vital signs reviewed and stable Respiratory status: spontaneous breathing and respiratory function stable Cardiovascular status: blood pressure returned to baseline and stable Postop Assessment: no apparent nausea or vomiting Anesthetic complications: no   No complications documented.   Last Vitals:  Vitals:   02/21/21 1130 02/21/21 1145  BP: (!) 116/59 116/64  Pulse: 84 81  Resp: (!) 24 (!) 23  Temp:    SpO2: 95% 95%    Last Pain:  Vitals:   02/21/21 1021  TempSrc: Oral                 Myna Bright

## 2021-02-21 NOTE — Transfer of Care (Signed)
Immediate Anesthesia Transfer of Care Note  Patient: Tara Santiago  Procedure(s) Performed: OPEN REDUCTION INTERNAL FIXATION (ORIF) ANKLE FRACTURE (Right Ankle)  Patient Location: PACU  Anesthesia Type:General  Level of Consciousness: awake, alert , oriented and patient cooperative  Airway & Oxygen Therapy: Patient Spontanous Breathing and Patient connected to nasal cannula oxygen  Post-op Assessment: Report given to RN, Post -op Vital signs reviewed and stable and Patient moving all extremities  Post vital signs: Reviewed and stable  Last Vitals:  Vitals Value Taken Time  BP    Temp    Pulse 88 02/21/21 1439  Resp 24 02/21/21 1439  SpO2 96 % 02/21/21 1439  Vitals shown include unvalidated device data.  Last Pain:  Vitals:   02/21/21 1021  TempSrc: Oral      Patients Stated Pain Goal: 6 (78/97/84 7841)  Complications: No complications documented.

## 2021-02-21 NOTE — Discharge Instructions (Signed)
Mark A. Amedeo Kinsman, MD [facility] [address] [place],  [place]  [address] Phone: [phone] Fax: [phone]   [place] ? Please keep splint clean dry and intact until followup.  ? You may shower on Post-Op Day #2.  ? You must keep splint dry during this process and may find that a plastic bag taped around the leg or alternatively a towel based bath may be a better option.   ? If you get your splint wet or if it is damaged please contact our clinic.  EXERCISES ? Due to your splint being in place you will not be able to bear weight through your extremity.   ? DO NOT PUT ANY WEIGHT ON YOUR OPERATIVE LEG ? Please use crutches or a walker to avoid weight bearing.   REGIONAL ANESTHESIA (NERVE BLOCKS) . The anesthesia team may have performed a nerve block for you if safe in the setting of your care.  This is a great tool used to minimize pain.  Typically the block may start wearing off overnight but the long acting medicine may last for 3-4 days.  The nerve block wearing off can be a challenging period but please utilize your as needed pain medications to try and manage this period.    POST-OP MEDICATIONS- Multimodal approach to pain control . In general your pain will be controlled with a combination of substances.  Prescriptions unless otherwise discussed are electronically sent to your pharmacy.  This is a carefully made plan we use to minimize narcotic use.     - Meloxicam OR Celebrex - Anti-inflammatory medication taken on a scheduled basis  - Acetaminophen - Non-narcotic pain medicine taken on a scheduled basis   - Oxycodone - This is a strong narcotic, to be used only on an "as needed" basis for pain.  -  Aspirin 67m - This medicine is used to minimize the risk of blood clots after surgery.             -          Zofran - take as needed for nausea   FOLLOW-UP ? If you develop a Fever (>101.5),  Redness or Drainage from the surgical incision site, please call our office to arrange for an evaluation. ? Please call the office to schedule a follow-up appointment for your incision check if you do not already have one, 10-14 days post-operatively.  IF YOU HAVE ANY QUESTIONS, PLEASE FEEL FREE TO CALL OUR OFFICE.  HELPFUL INFORMATION  ? If you had a block, it will wear off between 8-24 hrs postop typically.  This is period when your pain may go from nearly zero to the pain you would have had postop without the block.  This is an abrupt transition but nothing dangerous is happening.  You may take an extra dose of narcotic when this happens.  ? You should wean off your narcotic medicines as soon as you are able.  Most patients will be off or using minimal narcotics before their first postop appointment.   ? Elevating your leg will help with swelling and pain control.  You are encouraged to elevate your leg as much as possible in the first couple of weeks following surgery.  Imagine a drop of water on your toe, and your goal is to get that water back to your heart.  ? We suggest you use the pain medication the first night prior to  going to bed, in order to ease any pain when the anesthesia wears off. You should avoid taking pain medications on an empty stomach as it will make you nauseous.  ? Do not drink alcoholic beverages or take illicit drugs when taking pain medications.  ? In most states it is against the law to drive while you are in a splint or sling.  And certainly against the law to drive while taking narcotics.  ? You may return to work/school in the next couple of days when you feel up to it.   ? Pain medication may make you constipated.  Below are a few solutions to try in this order: - Decrease the amount of pain medication if you aren't having pain. - Drink lots of decaffeinated fluids. - Drink prune juice and/or each dried prunes  o If the first 3 don't work start with  additional solutions - Take Colace - an over-the-counter stool softener - Take Senokot - an over-the-counter laxative - Take Miralax - a stronger over-the-counter laxative      General Anesthesia, Adult, Care After This sheet gives you information about how to care for yourself after your procedure. Your health care provider may also give you more specific instructions. If you have problems or questions, contact your health care provider. What can I expect after the procedure? After the procedure, the following side effects are common:  Pain or discomfort at the IV site.  Nausea.  Vomiting.  Sore throat.  Trouble concentrating.  Feeling cold or chills.  Feeling weak or tired.  Sleepiness and fatigue.  Soreness and body aches. These side effects can affect parts of the body that were not involved in surgery. Follow these instructions at home: For the time period you were told by your health care provider:  Rest.  Do not participate in activities where you could fall or become injured.  Do not drive or use machinery.  Do not drink alcohol.  Do not take sleeping pills or medicines that cause drowsiness.  Do not make important decisions or sign legal documents.  Do not take care of children on your own.   Eating and drinking  Follow any instructions from your health care provider about eating or drinking restrictions.  When you feel hungry, start by eating small amounts of foods that are soft and easy to digest (bland), such as toast. Gradually return to your regular diet.  Drink enough fluid to keep your urine pale yellow.  If you vomit, rehydrate by drinking water, juice, or clear broth. General instructions  If you have sleep apnea, surgery and certain medicines can increase your risk for breathing problems. Follow instructions from your health care provider about wearing your sleep device: ? Anytime you are sleeping, including during daytime naps. ? While  taking prescription pain medicines, sleeping medicines, or medicines that make you drowsy.  Have a responsible adult stay with you for the time you are told. It is important to have someone help care for you until you are awake and alert.  Return to your normal activities as told by your health care provider. Ask your health care provider what activities are safe for you.  Take over-the-counter and prescription medicines only as told by your health care provider.  If you smoke, do not smoke without supervision.  Keep all follow-up visits as told by your health care provider. This is important. Contact a health care provider if:  You have nausea or vomiting that does not get better  with medicine.  You cannot eat or drink without vomiting.  You have pain that does not get better with medicine.  You are unable to pass urine.  You develop a skin rash.  You have a fever.  You have redness around your IV site that gets worse. Get help right away if:  You have difficulty breathing.  You have chest pain.  You have blood in your urine or stool, or you vomit blood. Summary  After the procedure, it is common to have a sore throat or nausea. It is also common to feel tired.  Have a responsible adult stay with you for the time you are told. It is important to have someone help care for you until you are awake and alert.  When you feel hungry, start by eating small amounts of foods that are soft and easy to digest (bland), such as toast. Gradually return to your regular diet.  Drink enough fluid to keep your urine pale yellow.  Return to your normal activities as told by your health care provider. Ask your health care provider what activities are safe for you. This information is not intended to replace advice given to you by your health care provider. Make sure you discuss any questions you have with your health care provider. Document Revised: 08/02/2020 Document Reviewed:  03/01/2020 Elsevier Patient Education  2021 [employer].

## 2021-02-21 NOTE — Interval H&P Note (Signed)
History and Physical Interval Note:  02/21/2021 12:03 PM  Tara Santiago  has presented today for surgery, with the diagnosis of Right ankle fracture.  The various methods of treatment have been discussed with the patient and family. After consideration of risks, benefits and other options for treatment, the patient has consented to  Procedure(s): OPEN REDUCTION INTERNAL FIXATION (ORIF) ANKLE FRACTURE (Right) as a surgical intervention.  The patient's history has been reviewed, patient examined, no change in status, stable for surgery.  I have reviewed the patient's chart and labs.  Questions were answered to the patient's satisfaction.     Mordecai Rasmussen

## 2021-02-22 ENCOUNTER — Encounter (HOSPITAL_COMMUNITY): Payer: Self-pay | Admitting: Orthopedic Surgery

## 2021-03-06 ENCOUNTER — Ambulatory Visit (INDEPENDENT_AMBULATORY_CARE_PROVIDER_SITE_OTHER): Payer: BLUE CROSS/BLUE SHIELD | Admitting: Orthopedic Surgery

## 2021-03-06 ENCOUNTER — Other Ambulatory Visit: Payer: Self-pay

## 2021-03-06 ENCOUNTER — Ambulatory Visit: Payer: BLUE CROSS/BLUE SHIELD

## 2021-03-06 ENCOUNTER — Encounter: Payer: Self-pay | Admitting: Orthopedic Surgery

## 2021-03-06 DIAGNOSIS — S82891D Other fracture of right lower leg, subsequent encounter for closed fracture with routine healing: Secondary | ICD-10-CM

## 2021-03-06 NOTE — Patient Instructions (Signed)
General Cast Instructions  1.  You were placed in a cast in clinic today.  Please keep the cast material clean, dry and intact.  Please do not use anything to itch the under the cast.  If it gets itchy, you can consider taking benadryl, or similar medication.  If the cast material gets wet, place it on a towel and use a hair dryer on a low setting. 2.  Tylenol or Ibuprofen/Naproxen as needed.   3.  Recommend elevating your extremity as much as possible to help with swelling. 4.  F/u 2-3 weeks, cast off and repeat XR

## 2021-03-06 NOTE — Progress Notes (Signed)
Orthopaedic Postop Note  Assessment: Tara Santiago is a 50 y.o. female s/p ORIF of Right ankle fracture  DOS: 02/21/2021  Plan: Sutures removed, steri strips placed Well padded short leg cast placed in clinic today NWB on the operative extremity Continue to take Aspirin 81 mg BID Tylenol or ibuprofen for pain as needed  Follow-up: Return in about 19 days (around 03/25/2021). XR at next visit: Right ankle  Subjective:  Chief Complaint  Patient presents with  . Postop    Right ankle     History of Present Illness: Tara Santiago is a 50 y.o. female who presents following the above stated procedure.  Pain is well controlled.  She tolerated her splint well.  No numbness or tingling.  She is not taking pain medication.  She has remained NWB, placing only slight pressure on her heel when she is using the commode.    Review of Systems: No fevers or chills No numbness or tingling No Chest Pain No shortness of breath   Objective: There were no vitals taken for this visit.  Physical Exam:  Alert and oriented, no acute distress  Seated in a wheelchair Lateral incision healing well, no surrounding erythema or drainage. Sensation intact to dorsal foot. Active motion intact to TA/EHL Medial blister and partial thickness pressure ulcer healing well.  No surrounding redness.  Area is not tender to palpation.  Toes are warm and well perfused.   IMAGING: I personally ordered and reviewed the following images   X-ray of the right ankle demonstrates plain screws on the lateral, distal fibula in good position.  Fracture lines are visible and have maintained the reduction.  A single tight rope is visualized across the syndesmosis, and remains in good position.  No acute injuries.  Mortise is intact.  Syndesmosis is reduced.  No evidence of hardware failure or loosening.  Impression: Right ankle fracture status post operative fixation in good position.  No adverse  features.   Tara Rasmussen, MD 03/06/2021 11:32 AM

## 2021-03-26 ENCOUNTER — Ambulatory Visit: Payer: BLUE CROSS/BLUE SHIELD

## 2021-03-26 ENCOUNTER — Other Ambulatory Visit: Payer: Self-pay

## 2021-03-26 ENCOUNTER — Ambulatory Visit (INDEPENDENT_AMBULATORY_CARE_PROVIDER_SITE_OTHER): Payer: BLUE CROSS/BLUE SHIELD | Admitting: Orthopedic Surgery

## 2021-03-26 ENCOUNTER — Encounter: Payer: Self-pay | Admitting: Orthopedic Surgery

## 2021-03-26 VITALS — Ht 67.0 in | Wt 241.0 lb

## 2021-03-26 DIAGNOSIS — S82891D Other fracture of right lower leg, subsequent encounter for closed fracture with routine healing: Secondary | ICD-10-CM

## 2021-03-26 NOTE — Progress Notes (Signed)
Orthopaedic Postop Note  Assessment: Tara Santiago is a 50 y.o. female s/p ORIF of Right ankle fracture  DOS: 02/21/2021  Plan: Cast removed in clinic today, and repeat x-rays demonstrates maintenance of alignment. Skin over the medial ankle continues to heal.  No concerning features at this time. New cast was placed. We will see her back in approximately 2-3 weeks for repeat evaluation.  We can also discuss transition to a walking boot at that time. All questions were answered, and she is amenable to this plan.  Cast application - right short leg cast   Verbal consent was obtained and the correct extremity was identified. A well padded, appropriately molded short leg cast was applied to the right lower leg Toes remained warm and well perfused.   There were no sharp edges Patient tolerated the procedure well Cast care instructions were provided    Follow-up: Return in about 2 weeks (around 04/09/2021). XR at next visit: Right ankle, out of cast.  Subjective:  Chief Complaint  Patient presents with  . Routine Post Op    Rt ankle ORIF DOS 02/21/21    History of Present Illness: Tara Santiago is a 50 y.o. female who presents following the above stated procedure.  She is now 1 month out from surgery, and doing well.  She tolerated the cast well.  She is not taking anything for pain.  She notes occasional mild pains.  No numbness or tingling.  She has maintained nonweightbearing status.  Review of Systems: No fevers or chills No numbness or tingling No Chest Pain No shortness of breath   Objective: Ht 5' 7"  (1.702 m)   Wt 241 lb (109.3 kg)   BMI 37.75 kg/m   Physical Exam:  Alert and oriented, no acute distress  Seated in a wheelchair Lateral incision healing well, no surrounding erythema or drainage.  Steri-Strips still in place. Sensation intact to dorsal foot. Active motion intact to TA/EHL Medial blister and partial thickness pressure ulcer healing well.   No surrounding redness.  Area is not tender to palpation.  There is a central area of skin which is healing, with overlying skin similar to a blister. Toes are warm and well perfused.   IMAGING: I personally ordered and reviewed the following images   X-ray of the right ankle demonstrates maintenance of alignment.  Hardware remains intact.  No evidence of failure or loosening.  Syndesmosis is appropriately reduced.  Mortise is intact.  No acute injuries.  Interval consolidation at the fracture site.  Impression: Right ankle fracture status post operative fixation in excellent alignment.  Mordecai Rasmussen, MD 03/26/2021 11:07 PM

## 2021-03-26 NOTE — Patient Instructions (Signed)

## 2021-04-09 ENCOUNTER — Other Ambulatory Visit: Payer: Self-pay

## 2021-04-09 ENCOUNTER — Ambulatory Visit (INDEPENDENT_AMBULATORY_CARE_PROVIDER_SITE_OTHER): Payer: BLUE CROSS/BLUE SHIELD | Admitting: Orthopedic Surgery

## 2021-04-09 ENCOUNTER — Encounter: Payer: Self-pay | Admitting: Orthopedic Surgery

## 2021-04-09 ENCOUNTER — Ambulatory Visit: Payer: BLUE CROSS/BLUE SHIELD

## 2021-04-09 VITALS — Ht 67.0 in | Wt 241.0 lb

## 2021-04-09 DIAGNOSIS — S82891D Other fracture of right lower leg, subsequent encounter for closed fracture with routine healing: Secondary | ICD-10-CM

## 2021-04-09 NOTE — Progress Notes (Signed)
Orthopaedic Postop Note  Assessment: Tara Santiago is a 50 y.o. female s/p ORIF of Right ankle fracture  DOS: 02/21/2021  Plan: Cast removed in clinic today, and repeat x-rays demonstrates maintenance of alignment. Skin over the medial ankle continues to heal.  No concerning features at this time. New cast was placed. We will see her back in approximately 2-3 weeks for repeat evaluation.  We can also discuss transition to a walking boot at that time. All questions were answered, and she is amenable to this plan.   Follow-up: Return in about 2 weeks (around 04/23/2021). XR at next visit: Right ankle, out of cast.  Subjective:  Chief Complaint  Patient presents with  . Routine Post Op    S/p Rt ankle ORIF DOS 02/21/21    History of Present Illness: Tara Santiago is a 50 y.o. female who presents following the above stated procedure.  She continues to improve.  She is now approximately 6 weeks out, doing well.  She has tolerated the cast well.  She is not taking anything for pain.  She is hoping to transition to a walking boot today.  Review of Systems: No fevers or chills No numbness or tingling No Chest Pain No shortness of breath   Objective: Ht 5' 7"  (1.702 m)   Wt 241 lb (109.3 kg)   BMI 37.75 kg/m   Physical Exam:  Alert and oriented, no acute distress  Seated in a wheelchair.  She does have some persistent swelling over the dorsum of the foot.  Lateral surgical incision is healing well without surrounding erythema or drainage.  There is some residual scabbing.  The medial ankle partial-thickness ulcer remains healthy.  There is no surrounding erythema.  There is no drainage.  Small amount of clear fluid is expressed, similar to a blister.  Active motion to the TA/EHL intact.  IMAGING: I personally ordered and reviewed the following images   X-ray of the right ankle was obtained in clinic today and compared to previous x-rays.  There is been no interval  displacement at the fracture sites.  No interval subsidence or failure of the hardware.  No acute injuries are noted.  There is evidence of healing at the fracture sites.  Impression: Right ankle fracture, status post operative fixation in excellent alignment.    Mordecai Rasmussen, MD 04/09/2021 12:24 PM

## 2021-04-09 NOTE — Patient Instructions (Addendum)
Continue to be non-weightbearing on your right ankle for the next 2 weeks.  Ok to shower; let soap and water run over the incisions.  Do not scrub.  Pat the incisions dry.  Do not submerge your incisions in the bath  Focus on good control of your blood sugar

## 2021-04-23 ENCOUNTER — Ambulatory Visit: Payer: BLUE CROSS/BLUE SHIELD

## 2021-04-23 ENCOUNTER — Other Ambulatory Visit: Payer: Self-pay

## 2021-04-23 ENCOUNTER — Encounter: Payer: Self-pay | Admitting: Orthopedic Surgery

## 2021-04-23 ENCOUNTER — Ambulatory Visit (INDEPENDENT_AMBULATORY_CARE_PROVIDER_SITE_OTHER): Payer: BLUE CROSS/BLUE SHIELD | Admitting: Orthopedic Surgery

## 2021-04-23 VITALS — Ht 67.0 in | Wt 241.0 lb

## 2021-04-23 DIAGNOSIS — S82891D Other fracture of right lower leg, subsequent encounter for closed fracture with routine healing: Secondary | ICD-10-CM | POA: Diagnosis not present

## 2021-04-23 NOTE — Progress Notes (Signed)
Orthopaedic Postop Note  Assessment: Tara Santiago is a 50 y.o. female s/p ORIF of Right ankle fracture  DOS: 02/21/2021  Plan: Tara Santiago continues to heal well.  The medial soft tissue injury is recovering.  She has some intact scabbing without surrounding erythema or drainage.  She is now remained nonweightbearing for 2 months since surgery.  Radiographs demonstrate signs of healing.  In her walking boot, she will now be allowed to advance her weightbearing.  We have placed a referral for physical therapy to allow her to increase her weightbearing.  She is interested in returning to work, and we have therefore provided her with a note allowing her to return for partial days starting next week.  Okay to work from 8-12 on a daily basis.  She will need frequent breaks as she returns to work.  Follow-up in 4 weeks.    Follow-up: Return in about 4 weeks (around 05/21/2021). XR at next visit: Right ankle, out of cast.  Subjective:  Chief Complaint  Patient presents with  . Routine Post Op    DOS 02/21/21    History of Present Illness: Tara Santiago is a 50 y.o. female who presents following the above stated procedure.  She has done well with immobilization, and remaining nonweightbearing.  She has been able to slowly increase her activity, without bearing weight.  No pain in the right ankle.  She has been taking care of the medial skin wound diligently.  Review of Systems: No fevers or chills No numbness or tingling No Chest Pain No shortness of breath   Objective: Ht 5' 7"  (1.702 m)   Wt 241 lb (109.3 kg)   BMI 37.75 kg/m   Physical Exam:  Alert and oriented, no acute distress  Seated in a wheelchair.  Mild persistent swelling, diffusely throughout the ankle.  Lateral surgical incisions healing well.  No surrounding erythema or drainage.  Lesion over the medial ankle continues to heal.  Scabs remain without surrounding erythema or drainage.  No fluid is expressed upon  palpation.  No tenderness to palpation throughout the ankle.  Active dorsiflexion of the ankle and the great toe.  Toes are warm and well-perfused.  IMAGING: I personally ordered and reviewed the following images   X-rays of the right ankle were obtained in clinic today and demonstrates stable fixation of a right ankle fracture.  No interval subsidence or hardware failure.  The mortise remains intact.  No syndesmotic disruption.  Impression: Healing right ankle fracture    Mordecai Rasmussen, MD 04/23/2021 12:19 PM

## 2021-04-23 NOTE — Patient Instructions (Signed)
Referral to PT for right ankle  Ok to return to work 04/29/21, limited duty form 8 am - 12.    Continue to use walker to assist with ambulation.  May require frequent breaks.  Elevate foot as much as possible

## 2021-04-25 IMAGING — DX DG TIBIA/FIBULA 2V*R*
4 series · 4 of 4 positions shown · non-contrast
Comparison: None.

CLINICAL DATA: Fell, right lower leg pain

EXAM:
RIGHT TIBIA AND FIBULA - 2 VIEW; RIGHT ANKLE - COMPLETE 3+ VIEW

[tibia ap (1 of 2)]
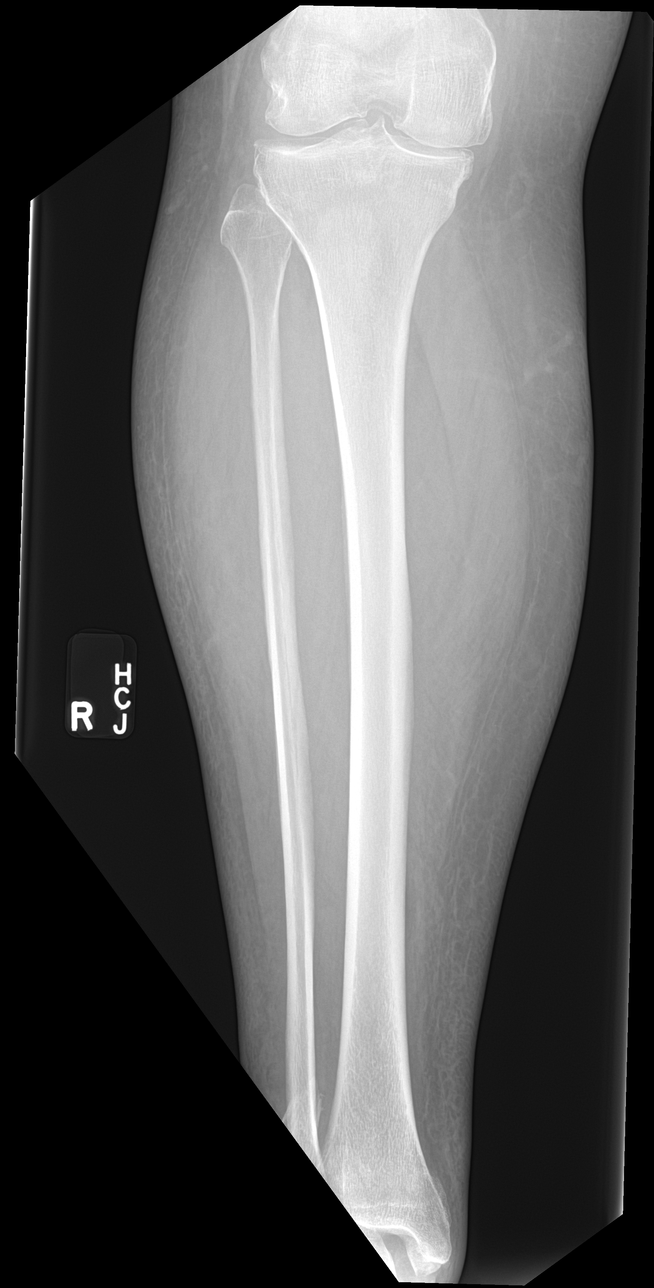

[tibia ap (2 of 2)]
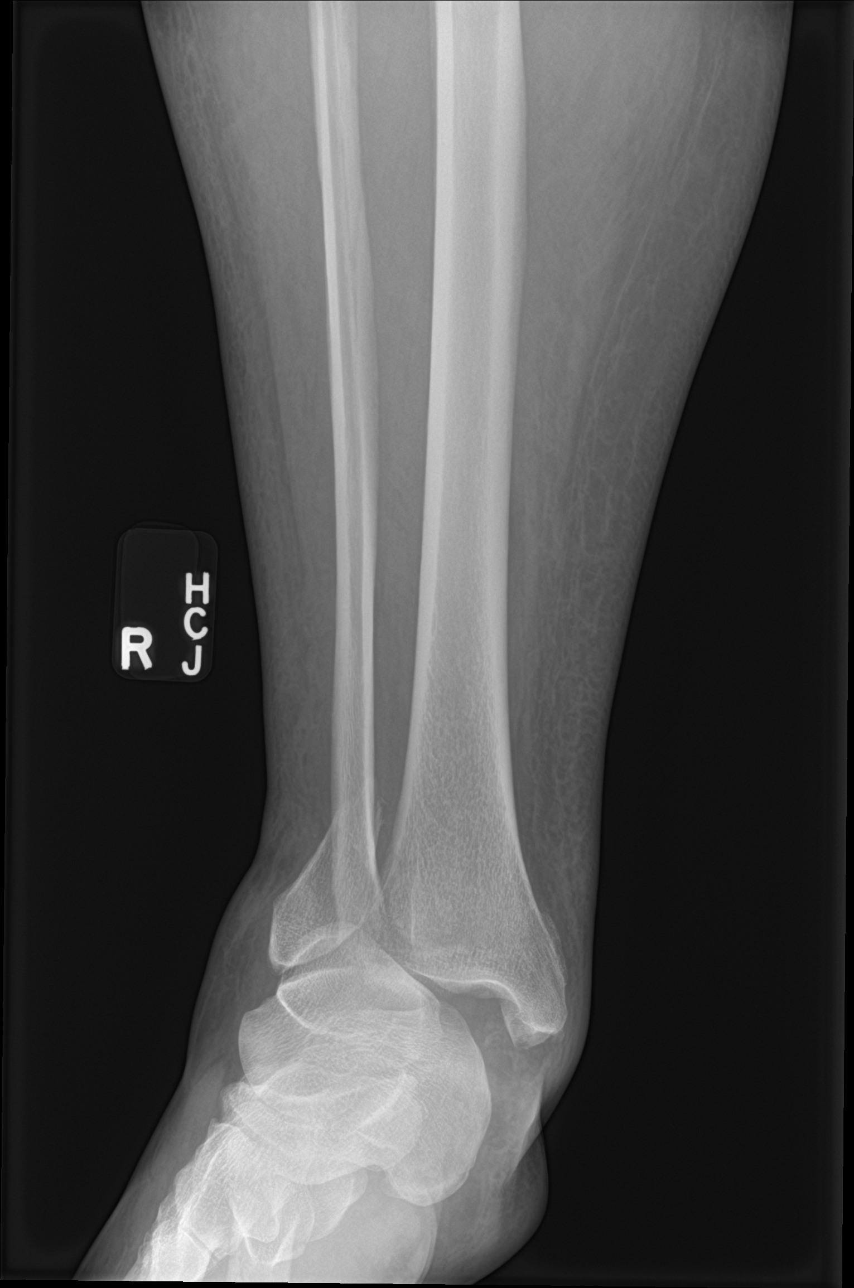

[tibia lat (1 of 2)]
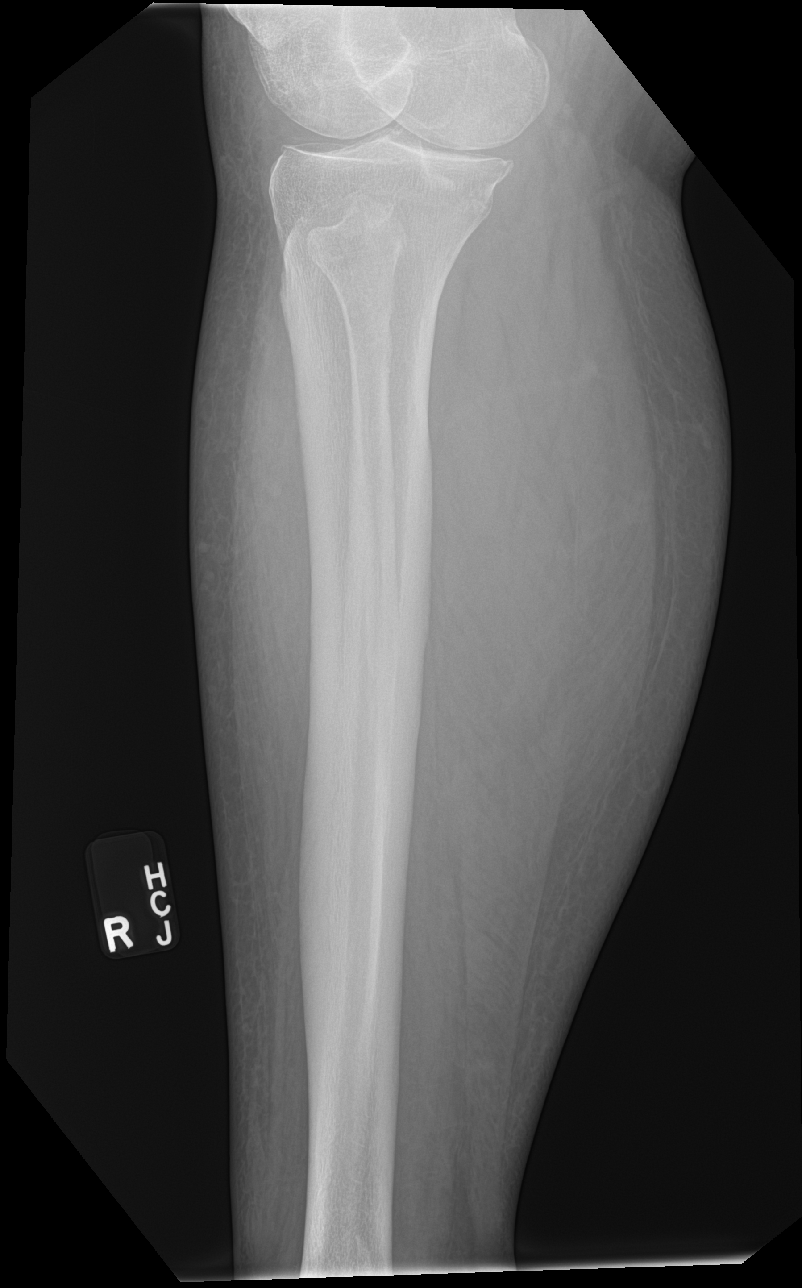

[tibia lat (2 of 2)]
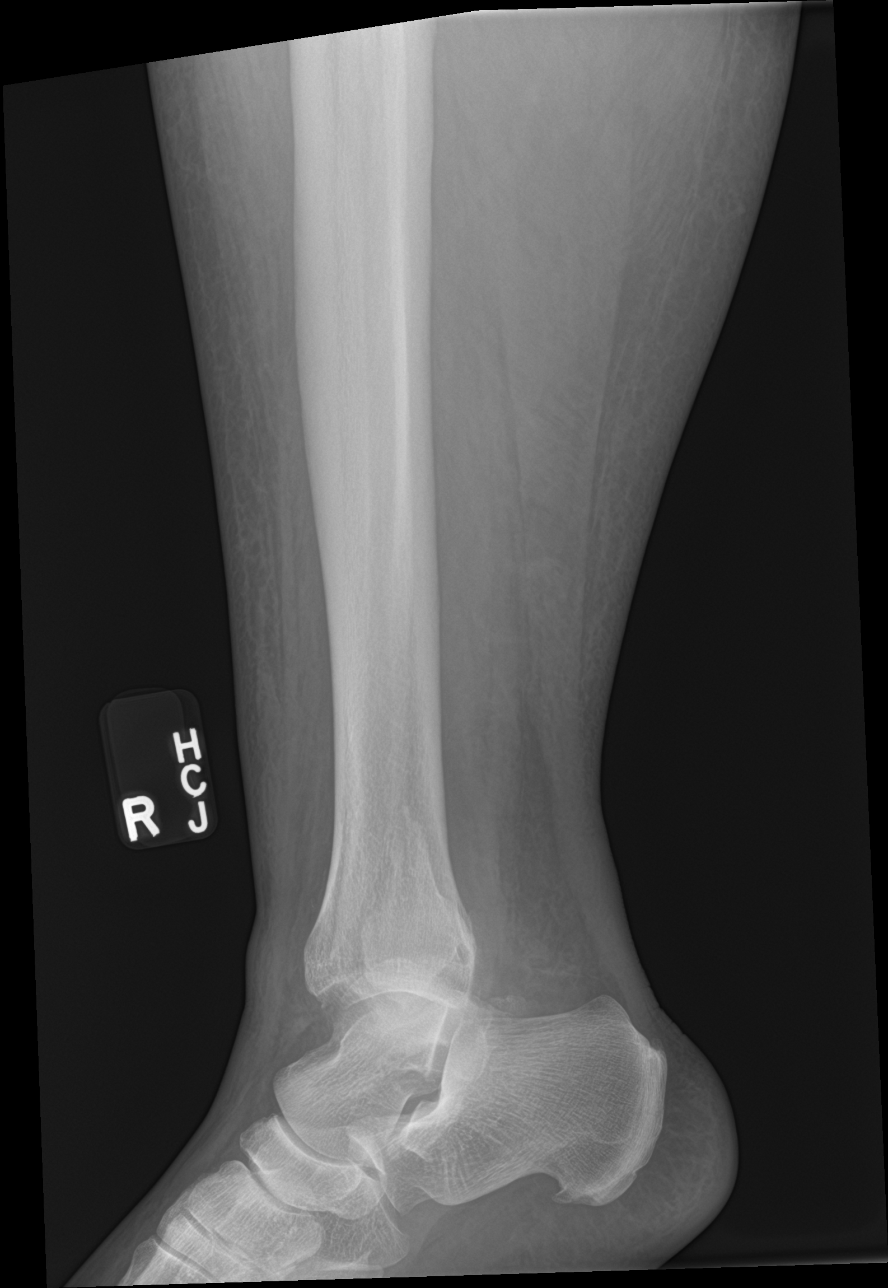

[4 of 4 positions shown; findings below may reference images not displayed]

FINDINGS: Right tibia/fibula: Frontal and lateral views are obtained. There is
a displaced oblique fracture through the distal fibular diaphysis.
The remainder of the right tibia and fibula are unremarkable. There
is lateral dislocation at the tibiotalar joint. Right knee is well
aligned, with moderate medial and lateral compartmental
osteoarthritis. Diffuse soft tissue edema.

Right ankle: Frontal, oblique, lateral views demonstrate a displaced
oblique distal fibular fracture. On the oblique projection, there is
a small ossific density medial to the talus, likely a small avulsion
fracture from the medial malleolus or medial aspect of the talus.
There is lateral dislocation of the tibiotalar joint. Diffuse soft
tissue edema.
IMPRESSION: 1. Oblique distal fibular fracture.
2. Small avulsion fracture off the medial malleolus or medial margin
of the talus.
3. Lateral dislocation of the tibiotalar joint.
4. Diffuse soft tissue edema right lower leg.

## 2021-04-30 ENCOUNTER — Encounter: Payer: BLUE CROSS/BLUE SHIELD | Admitting: Orthopedic Surgery

## 2021-05-07 ENCOUNTER — Ambulatory Visit (HOSPITAL_COMMUNITY): Payer: BLUE CROSS/BLUE SHIELD | Attending: Orthopedic Surgery | Admitting: Physical Therapy

## 2021-05-07 ENCOUNTER — Other Ambulatory Visit: Payer: Self-pay

## 2021-05-07 ENCOUNTER — Encounter (HOSPITAL_COMMUNITY): Payer: Self-pay | Admitting: Physical Therapy

## 2021-05-07 DIAGNOSIS — R262 Difficulty in walking, not elsewhere classified: Secondary | ICD-10-CM | POA: Diagnosis not present

## 2021-05-07 DIAGNOSIS — S91001A Unspecified open wound, right ankle, initial encounter: Secondary | ICD-10-CM | POA: Insufficient documentation

## 2021-05-07 DIAGNOSIS — M25571 Pain in right ankle and joints of right foot: Secondary | ICD-10-CM | POA: Insufficient documentation

## 2021-05-07 DIAGNOSIS — M6281 Muscle weakness (generalized): Secondary | ICD-10-CM | POA: Insufficient documentation

## 2021-05-07 NOTE — Therapy (Signed)
[facility] [place], [name], [address] Phone: [phone]   Fax:  [phone]  Physical Therapy Evaluation  Patient Details  Name: Tara Santiago MRN: [id number] Date of Birth: [birth year] Referring Provider (PT): Larena Glassman   Encounter Date: 05/07/2021   PT End of Session - 05/07/21 0959    Visit Number 1    Number of Visits 16    Date for PT Re-Evaluation 07/02/21    Authorization Type BCBS no auth, VL 30, 0 used    Authorization - Visit Number 1    Authorization - Number of Visits 30    Progress Note Due on Visit 10    PT Start Time 1000    PT Stop Time 1058    PT Time Calculation (min) 58 min    Activity Tolerance Patient tolerated treatment well    Behavior During Therapy [facility] for tasks assessed/performed           Past Medical History:  Diagnosis Date  . Diabetes mellitus without complication ([place])   . Hypertension   . Thyroid disease     Past Surgical History:  Procedure Laterality Date  . NO PAST SURGERIES    . ORIF ANKLE FRACTURE Right 02/21/2021   Procedure: OPEN REDUCTION INTERNAL FIXATION (ORIF) ANKLE FRACTURE;  Surgeon: Mordecai Rasmussen, MD;  Location: [facility];  Service: Orthopedics;  Laterality: Right;    There were no vitals filed for this visit.    Subjective Assessment - 05/07/21 1310    Subjective States she slipped and fell in the mud on 02/14/21 and twisted her ankle and she ultimately had surgery on 02/21/21 where they did a fixation and put her in a cast where she was non weightbearing. States on 5/24 she went to the MD and they removed the cast and put her in a walking boot and she is now weight bearing as tolerated. 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 States she has been walking with her walker.    Pertinent History DB    How long can you sit comfortably? no problem    Currently in Pain? Yes   hurts a little bit   Pain  Location Ankle    Pain Orientation Right    Pain Descriptors / Indicators Aching    Pain Type Surgical pain    Pain Onset More than a month ago    Pain Frequency Intermittent    Aggravating Factors  movement, walking    Pain Relieving Factors rest              [facility] PT Assessment - 05/07/21 0001      Assessment   Medical Diagnosis s/p fixation of right trimalleolar ankle fracture    Referring Provider (PT) Larena Glassman    Onset Date/Surgical Date 02/21/21    Next MD Visit 05/21/21    Prior Therapy no      Precautions   Required Braces or Orthoses --   CAM boot with weight bearing     Restrictions   Weight Bearing Restrictions Yes    RLE Weight Bearing Weight bearing as tolerated   in CAM boot     Balance Screen   Has the patient fallen in the past 6 months Yes    How many times? 2    Has the patient had a decrease in activity level because of a fear of falling?  Yes  Is the patient reluctant to leave their home because of a fear of falling?  Yes      [place] residence    Living Arrangements Spouse/significant other    Available Help at Discharge Family    Type of [place] One level    [place] - 2 wheels;Wheelchair - manual;Cane - single point;Shower seat;Grab bars - toilet      Prior Function   Level of Independence Independent    Vocation Full time employment    Vocation Requirements caregiver      Cognition   Overall Cognitive Status Within Functional Limits for tasks assessed      Observation/Other Assessments   Observations swelling noted thoughout right lower leg and ankle, medial incision open and with slough (see pictures) overall leg dirty    Focus on Therapeutic Outcomes (FOTO)  35% function      ROM / Strength   AROM / PROM / Strength AROM      AROM   AROM Assessment Site Ankle    Right/Left Ankle Right;Left      Palpation   Palpation comment  tenderness surrounding medial ankle incision      Special Tests   Other special tests negative hoffman's sign bilaterally      Transfers   Transfers Sit to Stand;Stand to Sit    Sit to Stand 5: Supervision;With upper extremity assist   bent forward     Ambulation/Gait   Ambulation/Gait Yes    Ambulation/Gait Assistance 5: Supervision    Ambulation Distance (Feet) 55 Feet    Assistive device Rolling walker    Gait Pattern Step-to pattern;Decreased stride length;Decreased stance time - right;Decreased dorsiflexion - right;Antalgic   in CAM boot   Ambulation Surface Level;Indoor    Gait velocity decreased    Gait Comments 56 seconds                      Objective measurements completed on examination: See above findings.      Wound Therapy - 05/07/21 0001    Prior Treatments none    Pain Scale Faces    Faces Pain Scale Hurts little more    Evaluation and Treatment Procedures Explained to Patient/Family Yes    Evaluation and Treatment Procedures agreed to    Wound Properties Date First Assessed: 05/07/21 Time First Assessed: 1000 Location: Ankle Location Orientation: Right;Medial   Wound Image View All Images View Images    Dressing Type None    Dressing Changed New    Dressing Status None    Dressing Change Frequency PRN    Site / Wound Assessment Painful;Yellow;Pink;Brown    % Wound base Yellow/Fibrinous Exudate 50%    % Wound base Black/Eschar 50%    Peri-wound Assessment Erythema (blanchable);Edema    Margins Unattached edges (unapproximated)    Drainage Amount Scant    Drainage Description Serosanguineous    Treatment Cleansed    Wound Therapy - Clinical Statement Washed leg and wound area and then moisturized skin. Dry flaky dirty skin throughout right lower leg. Slough and necrotic tissue noted along medial wound with painful borders. No debridement on this date, will contact MD about open wound and risk of poor healing secondary to diabetes and recent  surgery. Paitent would benefit from woundcare to reduce risk of infection and promote optimal healing.    Wound Therapy - Functional Problem  List bathing, walking, dressing    Factors Delaying/Impairing Wound Healing Diabetes Mellitus;Multiple medical problems   recent surgery   Wound Therapy - Frequency 2X / week    Wound Therapy - Current Recommendations PT    Wound Plan await for MD order prior to debridement - monitor wound and swelling, dressing changes and cleansing if no MD order for wound care - order sent 05/07/21    Dressing  medihoney on guaze, profore, number 5 netting                   PT Education - 05/07/21 1308    Education Details on wound status, on wound care, on compression, on how to doff dressing and when to (too tight or notable drainage or uncomfortable) on rehab, current swelling and how to elevate.    Person(s) Educated Patient    Methods Explanation    Comprehension Verbalized understanding            PT Short Term Goals - 05/07/21 1202      PT SHORT TERM GOAL #1   Title Wound will be completely healed    Time 3    Period Weeks    Status New    Target Date 05/28/21      PT SHORT TERM GOAL #2   Title Patient will report at least 25% improvement in overall symptoms and/or function to demonstrate improved functional mobility    Time 4    Period Weeks    Status New    Target Date 06/04/21      PT SHORT TERM GOAL #3   Title Patient will be able to ambulate at least 226 feet in 2 minutes with CAM boot and RW to demonstrate improved walking endurance    Time 4    Period Weeks    Status New    Target Date 06/04/21             PT Long Term Goals - 05/07/21 1204      PT LONG TERM GOAL #1   Title Patient will report at least 50% improvement in overall symptoms and/or function to demonstrate improved functional mobility    Time 8    Period Weeks    Status New    Target Date 07/02/21      PT LONG TERM GOAL #2   Title Patient will be able  to ascend and descend stairs with use of railing as needed without boot and step through gait pattern (per MD approval) to demonstrate improved stair mechanics.    Time 8    Period Weeks    Status New    Target Date 07/02/21      PT LONG TERM GOAL #3   Title Patient will be able to ambulate at least 226 feet in regular shoe and no assistive device (per MD approval) to improve overall walking mobility    Time 8    Period Weeks    Status New    Target Date 07/02/21      PT LONG TERM GOAL #4   Title Patient will improve on FOTO score to meet predicted outcomes to demonstrate improved functional mobility.    Time 8    Period Weeks    Status New    Target Date 07/02/21                  Plan - 05/07/21 1305    Clinical Impression Statement Patient presents to therapy after undergoing right tri-malleolar  fixation after a fall in March of this year. She was immobilized and non weightbearing for the last 2 months and presents today in a walking boot and instructions to be WBAT in her boot. Notable swelling noted throughout right foot and incision currently open with notable slough and surrounding redness and pain along medial ankle. Due to patient's diabetes, patient would benefit from wound care to reduce risk of infection and promote optimal healing. Cleansed and dressed wound and provided ample education on compression and dressing and when/how to remove. Will contact MD about wound care at this clinic prior to any debridement. Patient will also benefit from skilled physical therapy to improve overall functional mobility and independence.    Personal Factors and Comorbidities Comorbidity 1    Comorbidities DB    Examination-Activity Limitations Bathing;Squat;Stairs;Stand;Locomotion Level;Lift;Transfers    Examination-Participation Restrictions Meal Prep;Occupation;Driving;Community Activity;Shop;Cleaning    Stability/Clinical Decision Making Evolving/Moderate complexity    Clinical  Decision Making Moderate    Rehab Potential Good    PT Frequency 2x / week    PT Duration 8 weeks    PT Treatment/Interventions ADLs/Self Care Home Management;Cryotherapy;Neuromuscular re-education;Patient/family education;Manual techniques;Dry needling;Passive range of motion;Joint Manipulations;Electrical Stimulation;Therapeutic exercise;Therapeutic activities;Balance training;Functional mobility training;Stair training;Gait training;DME Instruction   wound care, debridement, modalities as needed   PT Next Visit Plan measure ankle ROM and edema, f/u with wound care (check for ORDER from MD), ankle ROM exercises, weight shifts    PT Home Exercise Plan ankle pumps, toe extension/flexion    Recommended Other Services woundcare    Consulted and Agree with Plan of Care Patient           Patient will benefit from skilled therapeutic intervention in order to improve the following deficits and impairments:  Pain,Difficulty walking,Decreased mobility,Decreased balance,Decreased range of motion,Decreased strength,Decreased activity tolerance,Decreased knowledge of precautions,Decreased scar mobility,Increased edema,Decreased skin integrity,Decreased endurance,Abnormal gait  Visit Diagnosis: Difficulty in walking, not elsewhere classified  Muscle weakness (generalized)  Pain in right ankle and joints of right foot  Ankle wound, right, initial encounter     Problem List There are no problems to display for this patient.   2:55 PM, 05/07/21 Jerene Pitch, DPT Physical Therapy with [facility]  [phone] office  [facility] [address] [place], [name], [address] Phone: [phone]   Fax:  [phone]  Name: MARIANNA CID MRN: [id number] Date of Birth: 1971/02/19

## 2021-05-13 ENCOUNTER — Telehealth: Payer: Self-pay | Admitting: Orthopedic Surgery

## 2021-05-13 ENCOUNTER — Encounter (HOSPITAL_COMMUNITY): Payer: Self-pay | Admitting: Physical Therapy

## 2021-05-13 ENCOUNTER — Other Ambulatory Visit: Payer: Self-pay

## 2021-05-13 ENCOUNTER — Ambulatory Visit (HOSPITAL_COMMUNITY): Payer: BLUE CROSS/BLUE SHIELD | Admitting: Physical Therapy

## 2021-05-13 DIAGNOSIS — M6281 Muscle weakness (generalized): Secondary | ICD-10-CM

## 2021-05-13 DIAGNOSIS — R262 Difficulty in walking, not elsewhere classified: Secondary | ICD-10-CM | POA: Diagnosis not present

## 2021-05-13 DIAGNOSIS — S91001A Unspecified open wound, right ankle, initial encounter: Secondary | ICD-10-CM

## 2021-05-13 DIAGNOSIS — M25571 Pain in right ankle and joints of right foot: Secondary | ICD-10-CM

## 2021-05-13 NOTE — Telephone Encounter (Signed)
Tara Santiago with [facility]  PT   Needs an authorization for patient wound care   She wants it done today.  Advised her Dr. Amedeo Kinsman is not in the office today and will be back in the office tomorrow.    She asked if someone else can sign it.  Advised her no the doctor will have to sign the order.   Please Call her back at [phone]

## 2021-05-13 NOTE — Therapy (Signed)
[facility] [place], [name], [address] Phone: [phone]   Fax:  [phone]  Physical Therapy Treatment  Patient Details  Name: Tara Santiago MRN: [id number] Date of Birth: 11/21/1971 Referring Provider (PT): Larena Glassman   Encounter Date: 05/13/2021   PT End of Session - 05/13/21 1637     Visit Number 2    Number of Visits 16    Date for PT Re-Evaluation 07/02/21    Authorization Type BCBS no auth, VL 30, 0 used    Authorization - Visit Number 2    Authorization - Number of Visits 30    Progress Note Due on Visit 10    PT Start Time 1600    PT Stop Time 1640    PT Time Calculation (min) 40 min    Equipment Utilized During Treatment Gait belt    Activity Tolerance Patient tolerated treatment well    Behavior During Therapy WFL for tasks assessed/performed             Past Medical History:  Diagnosis Date   Diabetes mellitus without complication ([place])    Hypertension    Thyroid disease     Past Surgical History:  Procedure Laterality Date   NO PAST SURGERIES     ORIF ANKLE FRACTURE Right 02/21/2021   Procedure: OPEN REDUCTION INTERNAL FIXATION (ORIF) ANKLE FRACTURE;  Surgeon: Mordecai Rasmussen, MD;  Location: [facility];  Service: Orthopedics;  Laterality: Right;    There were no vitals filed for this visit.   Subjective Assessment - 05/13/21 1605     Subjective Pt states that she has no pain at this time.  States that the dressing that was put on her last time broke her out.    Pertinent History DB    How long can you sit comfortably? no problem    Currently in Pain? No/denies    Pain Onset More than a month ago                [facility] PT Assessment - 05/13/21 0001       ROM / Strength   AROM / PROM / Strength Strength      AROM   Right Ankle Dorsiflexion 0    Right Ankle Plantar Flexion 35    Right Ankle Inversion 0    Right Ankle Eversion 8      Strength   Strength Assessment Site Ankle     Right/Left Ankle Right    Right Ankle Dorsiflexion 3+/5    Right Ankle Plantar Flexion 3-/5    Right Ankle Inversion 2+/5    Right Ankle Eversion 2+/5                         Wound Therapy - 05/13/21 0001     Pain Scale Faces    Faces Pain Scale No hurt    Evaluation and Treatment Procedures Explained to Patient/Family Yes    Evaluation and Treatment Procedures agreed to    Wound Properties Date First Assessed: 05/07/21 Time First Assessed: 1000 Location: Ankle Location Orientation: Right;Medial   Dressing Type None    Dressing Change Frequency PRN    Site / Wound Assessment Yellow    % Wound base Yellow/Fibrinous Exudate 100%    Wound Length (cm) 2 cm    Wound Width (cm) 0.3 cm    Wound Depth (cm) --   unknown   Wound Surface Area (cm^2) 0.6  cm^2    Drainage Amount Scant    Treatment Cleansed    Wound Therapy - Clinical Statement Washed leg and wound area and then moisturized skin. Dry flaky dirty skin throughout right lower leg. Slough and necrotic tissue noted along medial wound with painful borders. No debridement on this date, will contact MD about open wound and risk of poor healing secondary to diabetes and recent surgery. Paitent would benefit from woundcare to reduce risk of infection and promote optimal healing.    Wound Therapy - Functional Problem List bathing, walking, dressing    Factors Delaying/Impairing Wound Healing Diabetes Mellitus;Multiple medical problems   recent surgery   Wound Therapy - Frequency 2X / week    Wound Therapy - Current Recommendations PT    Dressing  vaseline, 2x2 profore tape              [facility] Adult PT Treatment/Exercise - 05/13/21 0001       Ambulation/Gait   Ambulation/Gait Yes    Ambulation/Gait Assistance 5: Supervision    Ambulation Distance (Feet) 55 Feet    Assistive device Straight cane    Gait Pattern Step-through pattern      Exercises   Exercises Ankle      Ankle Exercises: Stretches   Other  Stretch sitting heelslide      Ankle Exercises: Seated   ABC's 1 rep    Towel Crunch 2 reps    Heel Raises Right;10 reps    Toe Raise 10 reps    Other Seated Ankle Exercises ankle over Lt knee in/eversion    Other Seated Ankle Exercises ankle circles                    PT Education - 05/13/21 1641     Education Details HEP    Person(s) Educated Patient    Methods Explanation    Comprehension Verbalized understanding              PT Short Term Goals - 05/13/21 1645       PT SHORT TERM GOAL #1   Title Wound will be completely healed    Time 3    Period Weeks    Status On-going    Target Date 05/28/21      PT SHORT TERM GOAL #2   Title Patient will report at least 25% improvement in overall symptoms and/or function to demonstrate improved functional mobility    Time 4    Period Weeks    Status On-going    Target Date 06/04/21      PT SHORT TERM GOAL #3   Title Patient will be able to ambulate at least 226 feet in 2 minutes with CAM boot and RW to demonstrate improved walking endurance    Time 4    Period Weeks    Status On-going    Target Date 06/04/21               PT Long Term Goals - 05/13/21 1645       PT LONG TERM GOAL #1   Title Patient will report at least 50% improvement in overall symptoms and/or function to demonstrate improved functional mobility    Time 8    Period Weeks    Status On-going      PT LONG TERM GOAL #2   Title Patient will be able to ascend and descend stairs with use of railing as needed without boot and step through gait pattern (per MD approval) to  demonstrate improved stair mechanics.    Time 8    Period Weeks    Status On-going      PT LONG TERM GOAL #3   Title Patient will be able to ambulate at least 226 feet in regular shoe and no assistive device (per MD approval) to improve overall walking mobility    Time 8    Period Weeks    Status On-going      PT LONG TERM GOAL #4   Title Patient will improve  on FOTO score to meet predicted outcomes to demonstrate improved functional mobility.    Time 8    Period Weeks    Status On-going                   Plan - 05/13/21 1641     Clinical Impression Statement Pt appears to be putting over 50% of her wt on her ankle therefore thereapist worked on [employer] with a cane with good results.  PT wound cleansed scab has came off but wound bed is 100% yellow slough. Therapist explained the importance of cleansing daily.  Pt has significant limitation in both ROM and strength and will continue to benefit from skilled PT for wound care, and progressing therapuetic exercises.    Personal Factors and Comorbidities Comorbidity 1    Comorbidities DB    Examination-Activity Limitations Bathing;Squat;Stairs;Stand;Locomotion Level;Lift;Transfers    Examination-Participation Restrictions Meal Prep;Occupation;Driving;Community Activity;Shop;Cleaning    Stability/Clinical Decision Making Evolving/Moderate complexity    Clinical Decision Making Moderate    Rehab Potential Good    PT Frequency 2x / week    PT Duration 8 weeks    PT Treatment/Interventions ADLs/Self Care Home Management;Cryotherapy;Neuromuscular re-education;Patient/family education;Manual techniques;Dry needling;Passive range of motion;Joint Manipulations;Electrical Stimulation;Therapeutic exercise;Therapeutic activities;Balance training;Functional mobility training;Stair training;Gait training;DME Instruction    PT Next Visit Plan begin isometric exercises and PROM; hip IR    PT Home Exercise Plan ankle pumps, toe extension/flexion; 6/13:  Ankle ROM , sittng heelslide and ankle alphabet exercises             Patient will benefit from skilled therapeutic intervention in order to improve the following deficits and impairments:  Pain, Difficulty walking, Decreased mobility, Decreased balance, Decreased range of motion, Decreased strength, Decreased activity tolerance, Decreased knowledge  of precautions, Decreased scar mobility, Increased edema, Decreased skin integrity, Decreased endurance, Abnormal gait  Visit Diagnosis: Muscle weakness (generalized)  Difficulty in walking, not elsewhere classified  Pain in right ankle and joints of right foot  Ankle wound, right, initial encounter     Problem List There are no problems to display for this patient.   Rayetta Humphrey, PT CLT [phone]  05/13/2021, 4:47 PM  [facility] [address] [place], [name], [address] Phone: [phone]   Fax:  [phone]  Name: Tara Santiago MRN: [id number] Date of Birth: 12-May-1971

## 2021-05-15 ENCOUNTER — Ambulatory Visit (HOSPITAL_COMMUNITY): Payer: BLUE CROSS/BLUE SHIELD | Admitting: Physical Therapy

## 2021-05-20 ENCOUNTER — Ambulatory Visit (HOSPITAL_COMMUNITY): Payer: BLUE CROSS/BLUE SHIELD | Admitting: Physical Therapy

## 2021-05-21 ENCOUNTER — Encounter: Payer: Self-pay | Admitting: Orthopedic Surgery

## 2021-05-21 ENCOUNTER — Ambulatory Visit (INDEPENDENT_AMBULATORY_CARE_PROVIDER_SITE_OTHER): Payer: BLUE CROSS/BLUE SHIELD | Admitting: Orthopedic Surgery

## 2021-05-21 ENCOUNTER — Ambulatory Visit: Payer: BLUE CROSS/BLUE SHIELD

## 2021-05-21 ENCOUNTER — Telehealth: Payer: Self-pay | Admitting: Orthopedic Surgery

## 2021-05-21 ENCOUNTER — Other Ambulatory Visit: Payer: Self-pay

## 2021-05-21 VITALS — Ht 67.0 in | Wt 241.0 lb

## 2021-05-21 DIAGNOSIS — S82891D Other fracture of right lower leg, subsequent encounter for closed fracture with routine healing: Secondary | ICD-10-CM

## 2021-05-21 NOTE — Progress Notes (Signed)
Orthopaedic Postop Note  Assessment: Tara Santiago is a 50 y.o. female s/p ORIF of Right ankle fracture  DOS: 02/21/2021  Plan: She continues to slowly improve.  Encouraged to wean out of the boot, and into a regular shoe.  She can start driving again, but I would prefer she start driving again, after she has stopped using the CAM walker.  Continue working with PT.  Continue with wound care for medial wound at recommendation of PT.  Can increase activity as tolerated.  Continue working 8-12 daily, can increase when ready.    Follow-up: Return in about 3 months (around 08/21/2021). XR at next visit: Right ankle  Subjective:  Chief Complaint  Patient presents with   Routine Post Op    S/P Rt ankle ORIF DOS 02/21/21    History of Present Illness: Tara Santiago is a 50 y.o. female who presents following the above stated procedure.  She is 3 months out from surgery.  Pain is controlled.  Medial ankle wound is improving.  She is keeping it covered with polysporin.  Working well with PT and exercises on her own.   Review of Systems: No fevers or chills No numbness or tingling No Chest Pain No shortness of breath   Objective: Ht 5' 7"  (1.702 m)   Wt 241 lb (109.3 kg)   BMI 37.75 kg/m   Physical Exam:  Alert and oriented, no acute distress  Ambulating with a walker and walking boot.   Medial wound continues to heal.  No drainage.  Covered with dressing and polysporin.  Lateral incisions is healing well without surrounding erythema or drainage.  Small amount of scabbing remains.  Dry skin over much of foot.  Sensation intact to dorsum of foot.  Toes are warm and well perfused.  Active TA and EHL.  IMAGING: I personally ordered and reviewed the following images   XR of the right ankle obtained in clinic today and demonstrates stable alignment of the ankle.  No evidence of hardware failure.  No subsidence at the fracture site.  No medial clear space widening.  Syndesmosis  remains reduced.  No subsidence of the fracture or hardware.  Impression: Healing right ankle fracture    Mordecai Rasmussen, MD 05/21/2021 12:18 PM

## 2021-05-21 NOTE — Telephone Encounter (Signed)
Patient wants to know if she still needs to take baby asprin   Please call her back

## 2021-05-22 ENCOUNTER — Ambulatory Visit (HOSPITAL_COMMUNITY): Payer: BLUE CROSS/BLUE SHIELD | Admitting: Physical Therapy

## 2021-05-22 DIAGNOSIS — R262 Difficulty in walking, not elsewhere classified: Secondary | ICD-10-CM

## 2021-05-22 DIAGNOSIS — M25571 Pain in right ankle and joints of right foot: Secondary | ICD-10-CM

## 2021-05-22 DIAGNOSIS — S91001A Unspecified open wound, right ankle, initial encounter: Secondary | ICD-10-CM

## 2021-05-22 DIAGNOSIS — M6281 Muscle weakness (generalized): Secondary | ICD-10-CM

## 2021-05-22 NOTE — Therapy (Signed)
[place] [place], [name], [address] Phone: [phone]   Fax:  [phone]  Wound Care Therapy  Patient Details  Name: Tara RIBAUDO MRN: [id number] Date of Birth: 1971/10/14 Referring Provider (PT): Larena Glassman   Encounter Date: 05/22/2021   PT End of Session - 05/22/21 1731     Visit Number 3    Number of Visits 16    Date for PT Re-Evaluation 07/02/21    Authorization Type BCBS no auth, VL 30, 0 used    Authorization - Visit Number 3    Authorization - Number of Visits 30    Progress Note Due on Visit 10    PT Start Time 1538    PT Stop Time 1618    PT Time Calculation (min) 40 min    Equipment Utilized During Treatment Gait belt    Activity Tolerance Patient tolerated treatment well    Behavior During Therapy WFL for tasks assessed/performed             Past Medical History:  Diagnosis Date   Diabetes mellitus without complication ([place])    Hypertension    Thyroid disease     Past Surgical History:  Procedure Laterality Date   NO PAST SURGERIES     ORIF ANKLE FRACTURE Right 02/21/2021   Procedure: OPEN REDUCTION INTERNAL FIXATION (ORIF) ANKLE FRACTURE;  Surgeon: Mordecai Rasmussen, MD;  Location: [facility];  Service: Orthopedics;  Laterality: Right;    There were no vitals filed for this visit.         Therex:  PROM and AAROM for all ankle motions 10X for Rt ankle      Wound Therapy - 05/22/21 0001     Subjective pt states MD discontinued CAM walker. Pt comes today wearing normal tennis shoe.  States she had to remove the bandaging because her leg was itching.    Pain Scale Faces    Faces Pain Scale No hurt    Evaluation and Treatment Procedures Explained to Patient/Family Yes    Evaluation and Treatment Procedures agreed to    Wound Properties Date First Assessed: 05/07/21 Time First Assessed: 1000 Location: Ankle Location Orientation: Right;Medial   Wound Image Images linked: 1     Dressing Type Gauze (Comment)   bandaid   Dressing Changed Changed    Dressing Status Old drainage    Dressing Change Frequency PRN    Site / Wound Assessment Yellow    % Wound base Red or Granulating 0%    % Wound base Yellow/Fibrinous Exudate 100%    Wound Length (cm) 1.9 cm    Wound Width (cm) 0.3 cm    Wound Depth (cm) 0.3 cm    Wound Volume (cm^3) 0.17 cm^3    Wound Surface Area (cm^2) 0.57 cm^2    Drainage Amount Scant    Drainage Description Serosanguineous    Treatment Cleansed;Debridement (Selective)    Wound Therapy - Clinical Statement Order received for woundcare.  Educated patient on importance of using compression system bandage along with proper dressing due to her edema.  Pt verbalized understanding.  Instructed to remove layer at a time if needed but to keep wound covered. Also encouraged not to use bandaid as it trapped moisture and causes the rashy bumps present when removed bandage this session.   Able to debride all loose slough from wound but unable to remove adherent slough in base of wound.  Approximate depth of 0.3cm at this point.  Moisturized LE well with lotion and vaseline and continued with medihoney gel on gauze. Profore system was utilized with 1st and 2nd layer reversed due to irritation from cotton on skin.  Pt reported overall comfort.  Recommended getting a larger shoe for Rt as unable to don the shoe she wore into clinic.    Wound Therapy - Functional Problem List bathing, walking, dressing    Factors Delaying/Impairing Wound Healing Diabetes Mellitus;Multiple medical problems    Hydrotherapy Plan Debridement;Dressing change    Wound Therapy - Frequency 2X / week    Wound Therapy - Current Recommendations PT    Wound Plan Continue with woundcare utilizing appropriate dressings.    Dressing  medihoney gel, 2X2, profore system.  Used lotion f/b vaseline to moisturize dry skin.                     PT Education - 05/22/21 1729     Education  Details importance of compression for wound healing when edema is present.  Suggested knee high compression following wound healing due to ongoing edema from surgical procedure.  Measured and given order form for [facility].    Person(s) Educated Patient    Methods Handout;Explanation    Comprehension Verbalized understanding              PT Short Term Goals - 05/13/21 1645       PT SHORT TERM GOAL #1   Title Wound will be completely healed    Time 3    Period Weeks    Status On-going    Target Date 05/28/21      PT SHORT TERM GOAL #2   Title Patient will report at least 25% improvement in overall symptoms and/or function to demonstrate improved functional mobility    Time 4    Period Weeks    Status On-going    Target Date 06/04/21      PT SHORT TERM GOAL #3   Title Patient will be able to ambulate at least 226 feet in 2 minutes with CAM boot and RW to demonstrate improved walking endurance    Time 4    Period Weeks    Status On-going    Target Date 06/04/21               PT Long Term Goals - 05/13/21 1645       PT LONG TERM GOAL #1   Title Patient will report at least 50% improvement in overall symptoms and/or function to demonstrate improved functional mobility    Time 8    Period Weeks    Status On-going      PT LONG TERM GOAL #2   Title Patient will be able to ascend and descend stairs with use of railing as needed without boot and step through gait pattern (per MD approval) to demonstrate improved stair mechanics.    Time 8    Period Weeks    Status On-going      PT LONG TERM GOAL #3   Title Patient will be able to ambulate at least 226 feet in regular shoe and no assistive device (per MD approval) to improve overall walking mobility    Time 8    Period Weeks    Status On-going      PT LONG TERM GOAL #4   Title Patient will improve on FOTO score to meet predicted outcomes to demonstrate improved functional mobility.    Time 8    Period  Weeks    Status On-going                    Patient will benefit from skilled therapeutic intervention in order to improve the following deficits and impairments:     Visit Diagnosis: Muscle weakness (generalized)  Difficulty in walking, not elsewhere classified  Pain in right ankle and joints of right foot  Ankle wound, right, initial encounter     Problem List There are no problems to display for this patient.  Teena Irani, PTA/CLT [phone]  Teena Irani 05/22/2021, 5:32 PM  [facility] [address] [place], [name], [address] Phone: [phone]   Fax:  [phone]  Name: SHEARON CLONCH MRN: [id number] Date of Birth: 06/17/1971

## 2021-05-22 NOTE — Telephone Encounter (Signed)
Spoke with pt and let her know she can go back to her PCP prescribed dose of asa now. Pt verbalized understanding.

## 2021-05-28 ENCOUNTER — Other Ambulatory Visit: Payer: Self-pay

## 2021-05-28 ENCOUNTER — Ambulatory Visit (HOSPITAL_COMMUNITY): Payer: BLUE CROSS/BLUE SHIELD | Admitting: Physical Therapy

## 2021-05-28 DIAGNOSIS — M25571 Pain in right ankle and joints of right foot: Secondary | ICD-10-CM

## 2021-05-28 DIAGNOSIS — R262 Difficulty in walking, not elsewhere classified: Secondary | ICD-10-CM | POA: Diagnosis not present

## 2021-05-28 DIAGNOSIS — M6281 Muscle weakness (generalized): Secondary | ICD-10-CM

## 2021-05-28 DIAGNOSIS — S91001A Unspecified open wound, right ankle, initial encounter: Secondary | ICD-10-CM

## 2021-05-28 NOTE — Therapy (Signed)
[place], [name], [address] Phone: [phone]   Fax:  [phone]  Wound Care Therapy  Patient Details  Name: Tara Santiago MRN: [id number] Date of Birth: Jan 22, 1971 Referring Provider (PT): Larena Glassman   Encounter Date: 05/28/2021   PT End of Session - 05/28/21 1748     Visit Number 4    Number of Visits 16    Date for PT Re-Evaluation 07/02/21    Authorization Type BCBS no auth, VL 30, 0 used    Authorization - Visit Number 4    Authorization - Number of Visits 30    Progress Note Due on Visit 10    PT Start Time 1620    PT Stop Time 1704    PT Time Calculation (min) 44 min    Equipment Utilized During Treatment Gait belt    Activity Tolerance Patient tolerated treatment well    Behavior During Therapy WFL for tasks assessed/performed             Past Medical History:  Diagnosis Date   Diabetes mellitus without complication ([place])    Hypertension    Thyroid disease     Past Surgical History:  Procedure Laterality Date   NO PAST SURGERIES     ORIF ANKLE FRACTURE Right 02/21/2021   Procedure: OPEN REDUCTION INTERNAL FIXATION (ORIF) ANKLE FRACTURE;  Surgeon: Mordecai Rasmussen, MD;  Location: [facility];  Service: Orthopedics;  Laterality: Right;    There were no vitals filed for this visit.               Wound Therapy - 05/28/21 1741     Subjective Pt states she uses her cane some around the house and is doing her ROM exercises but feels she needs more than that.    Pain Scale 0-10    Pain Score 0-No pain    Faces Pain Scale No hurt    Evaluation and Treatment Procedures Explained to Patient/Family Yes    Evaluation and Treatment Procedures agreed to    Wound Properties Date First Assessed: 05/07/21 Time First Assessed: 1000 Location: Ankle Location Orientation: Right;Medial   Wound Image Images linked: 1    Dressing Type Gauze (Comment)   medihoney   Dressing Changed Changed     Dressing Status Old drainage    Dressing Change Frequency PRN    Site / Wound Assessment Yellow    % Wound base Red or Granulating 15%    % Wound base Yellow/Fibrinous Exudate 85%    Wound Length (cm) 1.6 cm    Wound Width (cm) 0.2 cm    Wound Depth (cm) 0.2 cm    Wound Volume (cm^3) 0.06 cm^3    Wound Surface Area (cm^2) 0.32 cm^2    Drainage Amount Scant    Drainage Description Serous    Treatment Cleansed;Debridement (Selective)    Wound Therapy - Clinical Statement Pt had removed coban layer from profore dressing stating it was uncomfortable.  Noted slippage from deompression.  Wound overall improved today in size.  Still with adherent slough but able to remove more of this today.  Continues to have edema in ankle and dorsal foot.  Cleansed well and moisturized prior to redressing with medihoney and profore system.  Continued with swap of 1st and 2nd layer and added 1/2" foam to LE and at medial ankle to help reduce edema and keep dressings from slipping.  Instructed pt with standing tandem balance and gastroc stetch.  Attempted SLS however unable to complete this.  Ensured pt was safe and had sequencing correct with gait using SPC.  Encouraged to use cane at home.    Wound Therapy - Functional Problem List bathing, walking, dressing    Factors Delaying/Impairing Wound Healing Diabetes Mellitus;Multiple medical problems    Hydrotherapy Plan Debridement;Dressing change    Wound Therapy - Frequency 2X / week    Wound Therapy - Current Recommendations PT    Wound Plan Continue with woundcare 1X week (tues/wed) and focus on ankle strengthening other visits.    Dressing  medihoney gel, 2X2,1/2" foam,  profore system.  Used lotion f/b vaseline to moisturize dry skin.             [facility] Adult PT Treatment/Exercise - 05/28/21 0001       Ambulation/Gait   Ambulation/Gait Yes    Ambulation/Gait Assistance 5: Supervision    Ambulation Distance (Feet) 50 Feet    Assistive device Straight cane     Gait Pattern Step-through pattern      Ankle Exercises: Stretches   Gastroc Stretch 1 rep;30 seconds;Limitations   standing leaning against counter     Ankle Exercises: Standing   SLS attempted unable    Other Standing Ankle Exercises tandem stance 30" each no UE assist (modified with Rt in back due to gastroc tightness)                      PT Short Term Goals - 05/13/21 1645       PT SHORT TERM GOAL #1   Title Wound will be completely healed    Time 3    Period Weeks    Status On-going    Target Date 05/28/21      PT SHORT TERM GOAL #2   Title Patient will report at least 25% improvement in overall symptoms and/or function to demonstrate improved functional mobility    Time 4    Period Weeks    Status On-going    Target Date 06/04/21      PT SHORT TERM GOAL #3   Title Patient will be able to ambulate at least 226 feet in 2 minutes with CAM boot and RW to demonstrate improved walking endurance    Time 4    Period Weeks    Status On-going    Target Date 06/04/21               PT Long Term Goals - 05/13/21 1645       PT LONG TERM GOAL #1   Title Patient will report at least 50% improvement in overall symptoms and/or function to demonstrate improved functional mobility    Time 8    Period Weeks    Status On-going      PT LONG TERM GOAL #2   Title Patient will be able to ascend and descend stairs with use of railing as needed without boot and step through gait pattern (per MD approval) to demonstrate improved stair mechanics.    Time 8    Period Weeks    Status On-going      PT LONG TERM GOAL #3   Title Patient will be able to ambulate at least 226 feet in regular shoe and no assistive device (per MD approval) to improve overall walking mobility    Time 8    Period Weeks    Status On-going      PT LONG TERM GOAL #4   Title Patient will  improve on FOTO score to meet predicted outcomes to demonstrate improved functional mobility.    Time 8     Period Weeks    Status On-going                    Patient will benefit from skilled therapeutic intervention in order to improve the following deficits and impairments:     Visit Diagnosis: Muscle weakness (generalized)  Difficulty in walking, not elsewhere classified  Ankle wound, right, initial encounter  Pain in right ankle and joints of right foot     Problem List There are no problems to display for this patient.  Teena Irani, PTA/CLT [phone]   Teena Irani 05/28/2021, 6:03 PM  [facility] [address] [place], [name], [address] Phone: [phone]   Fax:  [phone]  Name: RASHMI TALLENT MRN: [id number] Date of Birth: [birth year]

## 2021-05-31 ENCOUNTER — Ambulatory Visit (HOSPITAL_COMMUNITY): Payer: BLUE CROSS/BLUE SHIELD | Attending: Orthopedic Surgery

## 2021-05-31 ENCOUNTER — Encounter (HOSPITAL_COMMUNITY): Payer: Self-pay

## 2021-05-31 ENCOUNTER — Other Ambulatory Visit: Payer: Self-pay

## 2021-05-31 DIAGNOSIS — R262 Difficulty in walking, not elsewhere classified: Secondary | ICD-10-CM

## 2021-05-31 DIAGNOSIS — M6281 Muscle weakness (generalized): Secondary | ICD-10-CM

## 2021-05-31 DIAGNOSIS — S91001A Unspecified open wound, right ankle, initial encounter: Secondary | ICD-10-CM | POA: Diagnosis present

## 2021-05-31 DIAGNOSIS — M25571 Pain in right ankle and joints of right foot: Secondary | ICD-10-CM

## 2021-05-31 NOTE — Therapy (Signed)
[facility], [name], [address] Phone: [phone]   Fax:  [phone]  Wound Care Therapy  Patient Details  Name: Tara Santiago MRN: [id number] Date of Birth: [birth year] Referring Provider (PT): Larena Glassman   Encounter Date: 05/31/2021   PT End of Session - 05/31/21 1707     Visit Number 5    Number of Visits 16    Date for PT Re-Evaluation 07/02/21    Authorization Type BCBS no auth, VL 30, 0 used    Authorization - Visit Number 5    Authorization - Number of Visits 30    Progress Note Due on Visit 10    PT Start Time 1620    PT Stop Time 1658    PT Time Calculation (min) 38 min    Equipment Utilized During Treatment --   SBA   Activity Tolerance Patient tolerated treatment well    Behavior During Therapy WFL for tasks assessed/performed             Past Medical History:  Diagnosis Date   Diabetes mellitus without complication ([place])    Hypertension    Thyroid disease     Past Surgical History:  Procedure Laterality Date   NO PAST SURGERIES     ORIF ANKLE FRACTURE Right 02/21/2021   Procedure: OPEN REDUCTION INTERNAL FIXATION (ORIF) ANKLE FRACTURE;  Surgeon: Mordecai Rasmussen, MD;  Location: [facility];  Service: Orthopedics;  Laterality: Right;    There were no vitals filed for this visit.    Subjective Assessment - 05/31/21 1659     Subjective Pt stated she purchased a hurri-cane and likes it, has been using it at home and still using RW when out.  No reports of pain, dressings comfortably and not sliding down.    Pertinent History DB    Currently in Pain? No/denies                       Wound Therapy - 05/31/21 0001     Subjective Pt stated she purchased a hurri-cane and likes it, has been using it at home and still using RW when out.  No reports of pain, dressings comfortably and not sliding down.    Pain Scale 0-10    Pain Score 0-No pain    Evaluation and Treatment  Procedures Explained to Patient/Family Yes    Evaluation and Treatment Procedures agreed to    Wound Properties Date First Assessed: 05/07/21 Time First Assessed: 1000 Location: Ankle Location Orientation: Right;Medial   Wound Image Images linked: 1    Dressing Type Gauze (Comment)   medihoney, 2x2, vaseline, lotion, profore with 2 before 1 and 1/2in foam   Dressing Changed Changed    Dressing Status Old drainage    Dressing Change Frequency PRN    Site / Wound Assessment Yellow    % Wound base Red or Granulating 20%    % Wound base Yellow/Fibrinous Exudate 80%    Wound Length (cm) 1.6 cm    Wound Width (cm) 0.2 cm    Wound Depth (cm) 0.2 cm    Wound Volume (cm^3) 0.06 cm^3    Wound Surface Area (cm^2) 0.32 cm^2    Drainage Amount Scant    Drainage Description Serous    Treatment Cleansed;Debridement (Selective)    Wound Therapy - Clinical Statement Improved tolerance wiht profore and additional 1/2in foam.  Improved skin integrity and no bottle neck this session.  Selective debridement for removal of adherent slough especially superior part of wound.  Continued wiht medihoney and profore with 1/2in foam.  Reports of comfort at EOS.    Wound Therapy - Functional Problem List bathing, walking, dressing    Factors Delaying/Impairing Wound Healing Diabetes Mellitus;Multiple medical problems    Hydrotherapy Plan Debridement;Dressing change    Wound Therapy - Frequency 2X / week    Wound Therapy - Current Recommendations PT    Wound Plan Continue with woundcare 1X week (tues/wed) and focus on ankle strengthening other visits.    Dressing  medihoney gel, 2X2,1/2" foam,  profore system.  Used lotion f/b vaseline to moisturize dry skin.             [facility] Adult PT Treatment/Exercise - 05/31/21 0001       Ambulation/Gait   Ambulation/Gait Yes    Ambulation/Gait Assistance 5: Supervision    Ambulation Distance (Feet) 70 Feet    Assistive device Straight cane    Gait Pattern Step-through  pattern      Ankle Exercises: Stretches   Gastroc Stretch 3 reps;30 seconds   long sitting with towel                     PT Short Term Goals - 05/13/21 1645       PT SHORT TERM GOAL #1   Title Wound will be completely healed    Time 3    Period Weeks    Status On-going    Target Date 05/28/21      PT SHORT TERM GOAL #2   Title Patient will report at least 25% improvement in overall symptoms and/or function to demonstrate improved functional mobility    Time 4    Period Weeks    Status On-going    Target Date 06/04/21      PT SHORT TERM GOAL #3   Title Patient will be able to ambulate at least 226 feet in 2 minutes with CAM boot and RW to demonstrate improved walking endurance    Time 4    Period Weeks    Status On-going    Target Date 06/04/21               PT Long Term Goals - 05/13/21 1645       PT LONG TERM GOAL #1   Title Patient will report at least 50% improvement in overall symptoms and/or function to demonstrate improved functional mobility    Time 8    Period Weeks    Status On-going      PT LONG TERM GOAL #2   Title Patient will be able to ascend and descend stairs with use of railing as needed without boot and step through gait pattern (per MD approval) to demonstrate improved stair mechanics.    Time 8    Period Weeks    Status On-going      PT LONG TERM GOAL #3   Title Patient will be able to ambulate at least 226 feet in regular shoe and no assistive device (per MD approval) to improve overall walking mobility    Time 8    Period Weeks    Status On-going      PT LONG TERM GOAL #4   Title Patient will improve on FOTO score to meet predicted outcomes to demonstrate improved functional mobility.    Time 8    Period Weeks    Status On-going  Patient will benefit from skilled therapeutic intervention in order to improve the following deficits and impairments:     Visit Diagnosis: Muscle weakness  (generalized)  Difficulty in walking, not elsewhere classified  Ankle wound, right, initial encounter  Pain in right ankle and joints of right foot     Problem List There are no problems to display for this patient.  Ihor Austin, LPTA/CLT; CBIS [phone]  Aldona Lento 05/31/2021, 5:18 PM  [facility] [address] [place], [name], [address] Phone: [phone]   Fax:  [phone]  Name: Tara Santiago MRN: [id number] Date of Birth: [birth year]

## 2021-06-04 ENCOUNTER — Telehealth: Payer: Self-pay | Admitting: Orthopedic Surgery

## 2021-06-04 ENCOUNTER — Encounter: Payer: Self-pay | Admitting: Orthopedic Surgery

## 2021-06-04 ENCOUNTER — Other Ambulatory Visit: Payer: Self-pay

## 2021-06-04 ENCOUNTER — Ambulatory Visit (HOSPITAL_COMMUNITY): Payer: BLUE CROSS/BLUE SHIELD | Admitting: Physical Therapy

## 2021-06-04 DIAGNOSIS — S91001A Unspecified open wound, right ankle, initial encounter: Secondary | ICD-10-CM

## 2021-06-04 DIAGNOSIS — M6281 Muscle weakness (generalized): Secondary | ICD-10-CM

## 2021-06-04 DIAGNOSIS — M25571 Pain in right ankle and joints of right foot: Secondary | ICD-10-CM

## 2021-06-04 DIAGNOSIS — R262 Difficulty in walking, not elsewhere classified: Secondary | ICD-10-CM

## 2021-06-04 NOTE — Telephone Encounter (Signed)
Called back to patient to notify. Work note provided accordingly.

## 2021-06-04 NOTE — Therapy (Signed)
[place], [name], [address] Phone: [phone]   Fax:  [phone]  Physical Therapy Treatment  Patient Details  Name: Tara Santiago MRN: [id number] Date of Birth: [birth year] Referring Provider (PT): Larena Glassman   Encounter Date: 06/04/2021   PT End of Session - 06/04/21 1504     Visit Number 6    Number of Visits 16    Date for PT Re-Evaluation 07/02/21    Authorization Type BCBS no auth, VL 30, 0 used    Authorization - Visit Number 6    Authorization - Number of Visits 30    Progress Note Due on Visit 10    PT Start Time 1517    PT Stop Time 1743    PT Time Calculation (min) 48 min    Equipment Utilized During Treatment --   SBA   Activity Tolerance Patient tolerated treatment well    Behavior During Therapy [facility] for tasks assessed/performed             Past Medical History:  Diagnosis Date   Diabetes mellitus without complication ([place])    Hypertension    Thyroid disease     Past Surgical History:  Procedure Laterality Date   NO PAST SURGERIES     ORIF ANKLE FRACTURE Right 02/21/2021   Procedure: OPEN REDUCTION INTERNAL FIXATION (ORIF) ANKLE FRACTURE;  Surgeon: Mordecai Rasmussen, MD;  Location: [facility];  Service: Orthopedics;  Laterality: Right;    There were no vitals filed for this visit.   Subjective Assessment - 06/04/21 1406     Subjective pt states her dressing feels good and she's been doing her exercises at home.  No pain in her ankle just feels tight.    Currently in Pain? No/denies                               [facility] Adult PT Treatment/Exercise - 06/04/21 0001       Ankle Exercises: Standing   BAPS Standing;Level 2;10 reps;Limitations    Heel Raises Both;10 reps    Toe Raise 10 reps    Other Standing Ankle Exercises hip abduction 10X each    Other Standing Ankle Exercises tandem stance 30" each with max of 8" Lt lead and 11" Rt lead without UE assist       Ankle Exercises: Stretches   Slant Board Stretch 3 reps;30 seconds      Ankle Exercises: Seated   Towel Crunch Limitations    Towel Inversion/Eversion 5 reps;Limitations    Towel Inversion/Eversion Limitations with towel    Other Seated Ankle Exercises towel gastroc stretch 3X30" seated                    PT Education - 06/04/21 1447     Education Details encouraged to work more on inversion and add seated exercise/gastroc stretch to [employer]) Educated Patient    Methods Explanation    Comprehension Verbalized understanding              PT Short Term Goals - 05/13/21 1645       PT SHORT TERM GOAL #1   Title Wound will be completely healed    Time 3    Period Weeks    Status On-going    Target Date 05/28/21      PT SHORT TERM GOAL #2   Title Patient  will report at least 25% improvement in overall symptoms and/or function to demonstrate improved functional mobility    Time 4    Period Weeks    Status On-going    Target Date 06/04/21      PT SHORT TERM GOAL #3   Title Patient will be able to ambulate at least 226 feet in 2 minutes with CAM boot and RW to demonstrate improved walking endurance    Time 4    Period Weeks    Status On-going    Target Date 06/04/21               PT Long Term Goals - 05/13/21 1645       PT LONG TERM GOAL #1   Title Patient will report at least 50% improvement in overall symptoms and/or function to demonstrate improved functional mobility    Time 8    Period Weeks    Status On-going      PT LONG TERM GOAL #2   Title Patient will be able to ascend and descend stairs with use of railing as needed without boot and step through gait pattern (per MD approval) to demonstrate improved stair mechanics.    Time 8    Period Weeks    Status On-going      PT LONG TERM GOAL #3   Title Patient will be able to ambulate at least 226 feet in regular shoe and no assistive device (per MD approval) to improve overall  walking mobility    Time 8    Period Weeks    Status On-going      PT LONG TERM GOAL #4   Title Patient will improve on FOTO score to meet predicted outcomes to demonstrate improved functional mobility.    Time 8    Period Weeks    Status On-going                   Plan - 06/04/21 1505     Clinical Impression Statement Pt comes today using straight cane she purchased after last visit.  Bandages have stayed up and she has no pain at wound.  Focused this session on  progressing ankle strength and stability exercises.  Progressed with seated therex mostly working on ROM as she is still very limited with inversion.  Pt also observed with tendency to display her foot out into eversion with ambulation and general resting.   Encouraged to focus more on keeping foot into a neutral position and work on inversion.  Given seated gastroc stretch to add to HEP.    Personal Factors and Comorbidities Comorbidity 1    Comorbidities DB    Examination-Activity Limitations Bathing;Squat;Stairs;Stand;Locomotion Level;Lift;Transfers    Examination-Participation Restrictions Meal Prep;Occupation;Driving;Community Activity;Shop;Cleaning    Stability/Clinical Decision Making Evolving/Moderate complexity    Rehab Potential Good    PT Frequency 2x / week    PT Duration 8 weeks    PT Treatment/Interventions ADLs/Self Care Home Management;Cryotherapy;Neuromuscular re-education;Patient/family education;Manual techniques;Dry needling;Passive range of motion;Joint Manipulations;Electrical Stimulation;Therapeutic exercise;Therapeutic activities;Balance training;Functional mobility training;Stair training;Gait training;DME Instruction    PT Next Visit Plan [place]/change wound dressings on Thursdays.  Work on strenghtening/stab/ROM of Rt ankle other visits.    PT Home Exercise Plan ankle pumps, toe extension/flexion; 6/13:  Ankle ROM , sittng heelslide and ankle alphabet exercises  7/5:  seated towel pulls,  gastroc stretch with towel             Patient will benefit from skilled therapeutic intervention in order  to improve the following deficits and impairments:  Pain, Difficulty walking, Decreased mobility, Decreased balance, Decreased range of motion, Decreased strength, Decreased activity tolerance, Decreased knowledge of precautions, Decreased scar mobility, Increased edema, Decreased skin integrity, Decreased endurance, Abnormal gait  Visit Diagnosis: Muscle weakness (generalized)  Difficulty in walking, not elsewhere classified  Ankle wound, right, initial encounter  Pain in right ankle and joints of right foot     Problem List There are no problems to display for this patient.  Teena Irani, PTA/CLT [phone]  Teena Irani 06/04/2021, 3:07 PM  [facility] [address] [place], [name], [address] Phone: [phone]   Fax:  [phone]  Name: JALAYIA BAGHERI MRN: [id number] Date of Birth: 1971/11/10

## 2021-06-04 NOTE — Telephone Encounter (Signed)
Done

## 2021-06-04 NOTE — Patient Instructions (Signed)
Gastroc / Heel Cord Stretch - Seated With Towel    Sit on in chair or bed,  towel around ball of foot. Gently pull foot in toward body, stretching heel cord and calf. Hold for _30__ seconds. Repeat on involved leg. Repeat __3_ times. Do __3_ times per day.  Copyright  VHI. All rights reserved.

## 2021-06-04 NOTE — Telephone Encounter (Signed)
Patient called to relay she feels she will be ready to go back to her full hours for work, from 8:00am to 3:00pm, by 06/14/21. States Dr Amedeo Kinsman had relayed to let him know. Please advise if okay to issue the note accordingly.

## 2021-06-06 ENCOUNTER — Other Ambulatory Visit: Payer: Self-pay

## 2021-06-06 ENCOUNTER — Ambulatory Visit (HOSPITAL_COMMUNITY): Payer: BLUE CROSS/BLUE SHIELD | Admitting: Physical Therapy

## 2021-06-06 DIAGNOSIS — M6281 Muscle weakness (generalized): Secondary | ICD-10-CM

## 2021-06-06 DIAGNOSIS — S91001A Unspecified open wound, right ankle, initial encounter: Secondary | ICD-10-CM

## 2021-06-06 DIAGNOSIS — M25571 Pain in right ankle and joints of right foot: Secondary | ICD-10-CM

## 2021-06-06 DIAGNOSIS — R262 Difficulty in walking, not elsewhere classified: Secondary | ICD-10-CM

## 2021-06-06 NOTE — Therapy (Signed)
[place], [name], [address] Phone: [phone]   Fax:  [phone]  Wound Care Therapy  Patient Details  Name: Tara Santiago MRN: [id number] Date of Birth: August 04, 1971 Referring Provider (PT): Larena Glassman   Encounter Date: 06/06/2021   PT End of Session - 06/06/21 1726     Visit Number 7    Number of Visits 16    Date for PT Re-Evaluation 07/02/21    Authorization Type BCBS no auth, VL 30, 0 used    Authorization - Visit Number 7    Authorization - Number of Visits 30    Progress Note Due on Visit 10    PT Start Time 1450    PT Stop Time 1530    PT Time Calculation (min) 40 min    Equipment Utilized During Treatment --   SBA   Activity Tolerance Patient tolerated treatment well    Behavior During Therapy [facility] for tasks assessed/performed             Past Medical History:  Diagnosis Date   Diabetes mellitus without complication ([place])    Hypertension    Thyroid disease     Past Surgical History:  Procedure Laterality Date   NO PAST SURGERIES     ORIF ANKLE FRACTURE Right 02/21/2021   Procedure: OPEN REDUCTION INTERNAL FIXATION (ORIF) ANKLE FRACTURE;  Surgeon: Mordecai Rasmussen, MD;  Location: [facility];  Service: Orthopedics;  Laterality: Right;    There were no vitals filed for this visit.               Wound Therapy - 06/06/21 1717     Subjective pt comes today with spouse and shoes.  States she went to [employer] and they would not measure her Lt LE for compression stockings.  Pt also states they are too expensive and she would not be able to wear something that tight on her leg.    Pain Scale 0-10    Pain Score 0-No pain    Evaluation and Treatment Procedures Explained to Patient/Family Yes    Evaluation and Treatment Procedures agreed to    Wound Properties Date First Assessed: 05/07/21 Time First Assessed: 1000 Location: Ankle Location Orientation: Right;Medial   Wound  Image Images linked: 1    Dressing Type Gauze (Comment)    Dressing Changed Changed    Dressing Status Old drainage    Dressing Change Frequency PRN    Site / Wound Assessment Yellow;Red;Pink    % Wound base Red or Granulating 75%    % Wound base Yellow/Fibrinous Exudate 25%    Wound Length (cm) 1.4 cm    Wound Width (cm) 0.2 cm    Wound Depth (cm) 0.2 cm    Wound Volume (cm^3) 0.06 cm^3    Wound Surface Area (cm^2) 0.28 cm^2    Drainage Amount Scant    Drainage Description Serous    Treatment Cleansed;Debridement (Selective)    Wound Therapy - Clinical Statement pt requests not to replace with bandage.  Educated on how the compression helps with wound healing and her edema may return/slow healing without compression  pt still refuses compression.  Pt states she will try the lighter compression from [employer] [facility].  Pt given another pamphlet and measured again.  Attmepted to call during session, however facility is closed for July 4th holiday until July 11th.  Insructed to call monday morning to order.  continued with medihoney gel,guaze and used medipore  tape over to secure.    Wound Therapy - Functional Problem List bathing, walking, dressing    Factors Delaying/Impairing Wound Healing Diabetes Mellitus;Multiple medical problems    Hydrotherapy Plan Debridement;Dressing change    Wound Therapy - Frequency 2X / week    Wound Therapy - Current Recommendations PT    Wound Plan Continue with woundcare 1X week (tues/wed) and focus on ankle strengthening other visits.  Assess wound/edema next visit and discuss resuming coban if she has not received her stockings.    Dressing  medihoney gel, 2X2, medipore tape  Used lotion f/b vaseline to moisturize dry skin.                       PT Short Term Goals - 05/13/21 1645       PT SHORT TERM GOAL #1   Title Wound will be completely healed    Time 3    Period Weeks    Status On-going    Target Date 05/28/21      PT SHORT TERM  GOAL #2   Title Patient will report at least 25% improvement in overall symptoms and/or function to demonstrate improved functional mobility    Time 4    Period Weeks    Status On-going    Target Date 06/04/21      PT SHORT TERM GOAL #3   Title Patient will be able to ambulate at least 226 feet in 2 minutes with CAM boot and RW to demonstrate improved walking endurance    Time 4    Period Weeks    Status On-going    Target Date 06/04/21               PT Long Term Goals - 05/13/21 1645       PT LONG TERM GOAL #1   Title Patient will report at least 50% improvement in overall symptoms and/or function to demonstrate improved functional mobility    Time 8    Period Weeks    Status On-going      PT LONG TERM GOAL #2   Title Patient will be able to ascend and descend stairs with use of railing as needed without boot and step through gait pattern (per MD approval) to demonstrate improved stair mechanics.    Time 8    Period Weeks    Status On-going      PT LONG TERM GOAL #3   Title Patient will be able to ambulate at least 226 feet in regular shoe and no assistive device (per MD approval) to improve overall walking mobility    Time 8    Period Weeks    Status On-going      PT LONG TERM GOAL #4   Title Patient will improve on FOTO score to meet predicted outcomes to demonstrate improved functional mobility.    Time 8    Period Weeks    Status On-going                    Patient will benefit from skilled therapeutic intervention in order to improve the following deficits and impairments:     Visit Diagnosis: Muscle weakness (generalized)  Difficulty in walking, not elsewhere classified  Ankle wound, right, initial encounter  Pain in right ankle and joints of right foot     Problem List There are no problems to display for this patient.  Teena Irani, PTA/CLT [phone]  Roseanne Reno B 06/06/2021, 5:27 PM  [place], [name], [address] Phone: [phone]   Fax:  [phone]  Name: Tara Santiago MRN: [id number] Date of Birth: July 03, 1971

## 2021-06-11 ENCOUNTER — Ambulatory Visit (HOSPITAL_COMMUNITY): Payer: BLUE CROSS/BLUE SHIELD | Admitting: Physical Therapy

## 2021-06-11 ENCOUNTER — Other Ambulatory Visit: Payer: Self-pay

## 2021-06-11 DIAGNOSIS — M6281 Muscle weakness (generalized): Secondary | ICD-10-CM | POA: Diagnosis not present

## 2021-06-11 DIAGNOSIS — S91001A Unspecified open wound, right ankle, initial encounter: Secondary | ICD-10-CM

## 2021-06-11 DIAGNOSIS — M25571 Pain in right ankle and joints of right foot: Secondary | ICD-10-CM

## 2021-06-11 DIAGNOSIS — R262 Difficulty in walking, not elsewhere classified: Secondary | ICD-10-CM

## 2021-06-11 NOTE — Therapy (Addendum)
[place], [name], [address] Phone: [phone]   Fax:  [phone]  Physical Therapy Treatment  Patient Details  Name: Tara Santiago MRN: [id number] Date of Birth: Jun 13, 1971 Referring Provider (PT): Larena Glassman   Encounter Date: 06/11/2021   PT End of Session - 06/11/21 1501     Visit Number 8    Number of Visits 16    Date for PT Re-Evaluation 07/02/21    Authorization Type BCBS no auth, VL 30, 0 used    Authorization - Visit Number 8    Authorization - Number of Visits 30    Progress Note Due on Visit 10    PT Start Time 1404    PT Stop Time 1449    PT Time Calculation (min) 45 min    Equipment Utilized During Treatment --   SBA   Activity Tolerance Patient tolerated treatment well    Behavior During Therapy [facility] for tasks assessed/performed             Past Medical History:  Diagnosis Date   Diabetes mellitus without complication ([place])    Hypertension    Thyroid disease     Past Surgical History:  Procedure Laterality Date   NO PAST SURGERIES     ORIF ANKLE FRACTURE Right 02/21/2021   Procedure: OPEN REDUCTION INTERNAL FIXATION (ORIF) ANKLE FRACTURE;  Surgeon: Mordecai Rasmussen, MD;  Location: [facility];  Service: Orthopedics;  Laterality: Right;    There were no vitals filed for this visit.  Subjective:  Doing well today without any issues or pain.  Wound Properties Date First Assessed: 05/07/21 Time First Assessed: 1000 Location: Ankle Location Orientation: Right;Medial        Wound Image  View All Images --        Dressing Type Gauze (Comment)        Dressing Changed Changed        Dressing Status Old drainage        Dressing Change Frequency PRN        Site / Wound Assessment Yellow;Red;Pink        % Wound base Red or Granulating 85%        % Wound base Yellow/Fibrinous Exudate 15%        Drainage Amount Scant        Drainage Description Serous        Treatment Cleansed;Debridement  (Selective)                 Wound Therapy - Assess/Plan/Recommendations    Wound Therapy - Clinical Statement See below        Wound Therapy - Functional Problem List bathing, walking, dressing        Factors Delaying/Impairing Wound Healing Diabetes Mellitus;Multiple medical problems        Hydrotherapy Plan Debridement;Dressing change        Wound Therapy - Frequency 2X / week        Wound Therapy - Current Recommendations PT        Wound Plan Continue with woundcare 1X week (tues/wed) and focus on ankle strengthening other visits.  Assess wound/edema next visit and discuss resuming coban if she has not received her stockings.                 Wound Therapy    Dressing  medihoney gel, 2X2, medipore tape  Used lotion f/b vaseline to moisturize dry skin.  Wound Therapy - 06/11/21 1533     Evaluation and Treatment Procedures Explained to Patient/Family Yes    Evaluation and Treatment Procedures agreed to    Wound Properties Date First Assessed: 05/07/21 Time First Assessed: 1000 Location: Ankle Location Orientation: Right;Medial   Dressing Type Gauze (Comment)   medihoney   Dressing Changed Changed    Dressing Status Old drainage    Dressing Change Frequency PRN    Site / Wound Assessment Yellow;Red    % Wound base Red or Granulating 85%    % Wound base Yellow/Fibrinous Exudate 15%    Drainage Amount Scant    Drainage Description Serous    Treatment Cleansed;Debridement (Selective)    Wound Therapy - Clinical Statement see below.  Overall improving and approximating well.  Easily debrided slough from wound and continued with medoney gel gauze secured with medipore tape. Pt reported overall comfort with bandaging.    Wound Therapy - Functional Problem List bathing, walking, dressing    Factors Delaying/Impairing Wound Healing Diabetes Mellitus;Multiple medical problems    Hydrotherapy Plan Debridement;Dressing change    Wound Therapy - Frequency 2X  / week    Wound Therapy - Current Recommendations PT    Wound Plan Continue with woundcare 1X week (tues/wed) and focus on ankle strengthening other visits.  Assess wound/edema next visit and discuss resuming coban if she has not received her stockings.    Dressing  medihoney gel, 2X2, medipore tape              [facility] Adult PT Treatment/Exercise - 06/11/21 0001       Ankle Exercises: Standing   BAPS Standing;Level 2;10 reps;Limitations    Vector Stance Right;Left;5 seconds;Other (comment)   10 reps with 1 HHA   Heel Raises Both;20 reps   on incline   Toe Raise 20 reps   on incline   Other Standing Ankle Exercises hip abduction 10X each    Other Standing Ankle Exercises tandem stance 30" each with max of 8" Lt lead and 11" Rt lead without UE assist      Ankle Exercises: Stretches   Gastroc Stretch Limitations self stretch with towel    Slant Board Stretch 3 reps;30 seconds      Ankle Exercises: Seated   Towel Inversion/Eversion 5 reps                      PT Short Term Goals - 05/13/21 1645       PT SHORT TERM GOAL #1   Title Wound will be completely healed    Time 3    Period Weeks    Status On-going    Target Date 05/28/21      PT SHORT TERM GOAL #2   Title Patient will report at least 25% improvement in overall symptoms and/or function to demonstrate improved functional mobility    Time 4    Period Weeks    Status On-going    Target Date 06/04/21      PT SHORT TERM GOAL #3   Title Patient will be able to ambulate at least 226 feet in 2 minutes with CAM boot and RW to demonstrate improved walking endurance    Time 4    Period Weeks    Status On-going    Target Date 06/04/21               PT Long Term Goals - 05/13/21 1645       PT LONG TERM GOAL #  1   Title Patient will report at least 50% improvement in overall symptoms and/or function to demonstrate improved functional mobility    Time 8    Period Weeks    Status On-going      PT  LONG TERM GOAL #2   Title Patient will be able to ascend and descend stairs with use of railing as needed without boot and step through gait pattern (per MD approval) to demonstrate improved stair mechanics.    Time 8    Period Weeks    Status On-going      PT LONG TERM GOAL #3   Title Patient will be able to ambulate at least 226 feet in regular shoe and no assistive device (per MD approval) to improve overall walking mobility    Time 8    Period Weeks    Status On-going      PT LONG TERM GOAL #4   Title Patient will improve on FOTO score to meet predicted outcomes to demonstrate improved functional mobility.    Time 8    Period Weeks    Status On-going             Assessment:  Pt continues to do well with keeping bandage on and wound is now showing advanced signs of healing.  Able to remove nearly all slough from woundbed and measured with overall reduction in size.  Spent majority of time today completing LE stability exercises.  Added vectors with noted challenge for patient and having to use bil UE's  to complete.  Pt reported no pain or issues during or at end of session.  Plan:  continue with woundcare 1X week (tues/wed) and work on strengthening/ROM other visits.      Patient will benefit from skilled therapeutic intervention in order to improve the following deficits and impairments:     Visit Diagnosis: Muscle weakness (generalized)  Difficulty in walking, not elsewhere classified  Ankle wound, right, initial encounter  Pain in right ankle and joints of right foot     Problem List There are no problems to display for this patient.  Teena Irani, PTA/CLT [phone]   Teena Irani 06/11/2021, 3:36 PM  [facility] [address] [place], [name], [address] Phone: [phone]   Fax:  [phone]  Name: NIVA MURREN MRN: [id number] Date of Birth: May 06, 1971

## 2021-06-18 ENCOUNTER — Ambulatory Visit (HOSPITAL_COMMUNITY): Payer: BLUE CROSS/BLUE SHIELD | Admitting: Physical Therapy

## 2021-06-18 ENCOUNTER — Other Ambulatory Visit: Payer: Self-pay

## 2021-06-18 DIAGNOSIS — M6281 Muscle weakness (generalized): Secondary | ICD-10-CM

## 2021-06-18 DIAGNOSIS — S91001A Unspecified open wound, right ankle, initial encounter: Secondary | ICD-10-CM

## 2021-06-18 DIAGNOSIS — R262 Difficulty in walking, not elsewhere classified: Secondary | ICD-10-CM

## 2021-06-18 DIAGNOSIS — M25571 Pain in right ankle and joints of right foot: Secondary | ICD-10-CM

## 2021-06-18 NOTE — Therapy (Signed)
[place], [name], [address] Phone: [phone]   Fax:  [phone]  Physical Therapy Treatment  Patient Details  Name: Tara Santiago MRN: [id number] Date of Birth: 1971-06-26 Referring Provider (PT): Larena Glassman   Encounter Date: 06/18/2021   PT End of Session - 06/18/21 1643     Visit Number 9    Number of Visits 16    Date for PT Re-Evaluation 07/02/21    Authorization Type BCBS no auth, VL 30, 0 used    Authorization - Visit Number 9    Authorization - Number of Visits 30    Progress Note Due on Visit 10    PT Start Time 0539    PT Stop Time 1653    PT Time Calculation (min) 38 min    Equipment Utilized During Treatment --   SBA   Activity Tolerance Patient tolerated treatment well    Behavior During Therapy WFL for tasks assessed/performed             Past Medical History:  Diagnosis Date   Diabetes mellitus without complication ([place])    Hypertension    Thyroid disease     Past Surgical History:  Procedure Laterality Date   NO PAST SURGERIES     ORIF ANKLE FRACTURE Right 02/21/2021   Procedure: OPEN REDUCTION INTERNAL FIXATION (ORIF) ANKLE FRACTURE;  Surgeon: Mordecai Rasmussen, MD;  Location: [facility];  Service: Orthopedics;  Laterality: Right;    There were no vitals filed for this visit.   Subjective Assessment - 06/18/21 1658     Subjective pt states her balance is improving and her ankle is itching so she kknows it is almost healed.  Reports she got her knee high stockings and it helped alot with the swelling.  STates she is waiting on some new ones that have the toes in because she didn't like her toes being out.  No pain or issues today.    Currently in Pain? No/denies                             Wound Therapy - 06/18/21 1637     Subjective pt has received her stockings and wearing them.    Evaluation and Treatment Procedures Explained to Patient/Family Yes     Evaluation and Treatment Procedures agreed to    Wound Properties Date First Assessed: 05/07/21 Time First Assessed: 1000 Location: Ankle Location Orientation: Right;Medial   Wound Image Images linked: 1    Dressing Type Gauze (Comment)   medihoney gel   Dressing Changed Changed    Dressing Status Old drainage    Dressing Change Frequency PRN    Site / Wound Assessment Red    % Wound base Red or Granulating 100%    Wound Length (cm) 0.4 cm   2 small areas both this size   Wound Width (cm) 0.2 cm    Wound Depth (cm) 0.2 cm    Wound Volume (cm^3) 0.02 cm^3    Wound Surface Area (cm^2) 0.08 cm^2    Drainage Amount Scant    Drainage Description Serous    Treatment Cleansed;Debridement (Selective)    Wound Therapy - Clinical Statement Central portion of wound now healed with small areas on each end still open.  wound is now 100% granulated as well.  Debrided, cleansed and continued with medihoney gel gauze secured with medipore tape. Pt reported overall comfort with  bandaging.    Wound Therapy - Functional Problem List bathing, walking, dressing    Factors Delaying/Impairing Wound Healing Diabetes Mellitus;Multiple medical problems    Hydrotherapy Plan Debridement;Dressing change    Wound Therapy - Frequency 2X / week    Wound Therapy - Current Recommendations PT    Wound Plan Continue with woundcare and strengthening each visits    Dressing  medihoney gel, 2X2, medipore tape              [facility] Adult PT Treatment/Exercise - 06/18/21 0001       Ankle Exercises: Standing   Vector Stance Right;Left;5 seconds;Other (comment)    Heel Raises Both;20 reps    Toe Raise 20 reps    Other Standing Ankle Exercises tandem stance 30" each      Ankle Exercises: Stretches   Gastroc Stretch Limitations self stretch with towel    Slant Board Stretch 3 reps;30 seconds                      PT Short Term Goals - 05/13/21 1645       PT SHORT TERM GOAL #1   Title Wound will be  completely healed    Time 3    Period Weeks    Status On-going    Target Date 05/28/21      PT SHORT TERM GOAL #2   Title Patient will report at least 25% improvement in overall symptoms and/or function to demonstrate improved functional mobility    Time 4    Period Weeks    Status On-going    Target Date 06/04/21      PT SHORT TERM GOAL #3   Title Patient will be able to ambulate at least 226 feet in 2 minutes with CAM boot and RW to demonstrate improved walking endurance    Time 4    Period Weeks    Status On-going    Target Date 06/04/21               PT Long Term Goals - 05/13/21 1645       PT LONG TERM GOAL #1   Title Patient will report at least 50% improvement in overall symptoms and/or function to demonstrate improved functional mobility    Time 8    Period Weeks    Status On-going      PT LONG TERM GOAL #2   Title Patient will be able to ascend and descend stairs with use of railing as needed without boot and step through gait pattern (per MD approval) to demonstrate improved stair mechanics.    Time 8    Period Weeks    Status On-going      PT LONG TERM GOAL #3   Title Patient will be able to ambulate at least 226 feet in regular shoe and no assistive device (per MD approval) to improve overall walking mobility    Time 8    Period Weeks    Status On-going      PT LONG TERM GOAL #4   Title Patient will improve on FOTO score to meet predicted outcomes to demonstrate improved functional mobility.    Time 8    Period Weeks    Status On-going                   Plan - 06/18/21 1650     Clinical Impression Statement Wound care completed first with much improvement noted; entire central aspect of wound healed  with small 100% granulated openeings remaining on each end.  Pt has also received her stockings and these are working well.  Anticipate wound to be healed in next 1-2 more sessions.  Pt with improving ankle strength as witnessed by minimal LOB  with stability exercises.  Cues needed to increase stance time with ambulation due to antalgia.    Personal Factors and Comorbidities Comorbidity 1    Comorbidities DB    Examination-Activity Limitations Bathing;Squat;Stairs;Stand;Locomotion Level;Lift;Transfers    Examination-Participation Restrictions Meal Prep;Occupation;Driving;Community Activity;Shop;Cleaning    Stability/Clinical Decision Making Evolving/Moderate complexity    Rehab Potential Good    PT Frequency 2x / week    PT Duration 8 weeks    PT Treatment/Interventions ADLs/Self Care Home Management;Cryotherapy;Neuromuscular re-education;Patient/family education;Manual techniques;Dry needling;Passive range of motion;Joint Manipulations;Electrical Stimulation;Therapeutic exercise;Therapeutic activities;Balance training;Functional mobility training;Stair training;Gait training;DME Instruction    PT Next Visit Plan Debride/change wound dressings and strenghtening/stab/ROM of Rt ankle every visit.  complete 10th visit PN next session.    PT Home Exercise Plan ankle pumps, toe extension/flexion; 6/13:  Ankle ROM , sittng heelslide and ankle alphabet exercises  7/5:  seated towel pulls, gastroc stretch with towel             Patient will benefit from skilled therapeutic intervention in order to improve the following deficits and impairments:  Pain, Difficulty walking, Decreased mobility, Decreased balance, Decreased range of motion, Decreased strength, Decreased activity tolerance, Decreased knowledge of precautions, Decreased scar mobility, Increased edema, Decreased skin integrity, Decreased endurance, Abnormal gait  Visit Diagnosis: Muscle weakness (generalized)  Difficulty in walking, not elsewhere classified  Ankle wound, right, initial encounter  Pain in right ankle and joints of right foot     Problem List There are no problems to display for this patient.  Teena Irani, PTA/CLT [phone]  Teena Irani 06/18/2021, 5:01 PM  [facility] [address] [place], [name], [address] Phone: [phone]   Fax:  [phone]  Name: Tara Santiago MRN: [id number] Date of Birth: 10/03/1971

## 2021-06-25 ENCOUNTER — Ambulatory Visit (HOSPITAL_COMMUNITY): Payer: BLUE CROSS/BLUE SHIELD | Admitting: Physical Therapy

## 2021-06-25 ENCOUNTER — Other Ambulatory Visit: Payer: Self-pay

## 2021-06-25 DIAGNOSIS — S91001A Unspecified open wound, right ankle, initial encounter: Secondary | ICD-10-CM

## 2021-06-25 DIAGNOSIS — M6281 Muscle weakness (generalized): Secondary | ICD-10-CM

## 2021-06-25 DIAGNOSIS — R262 Difficulty in walking, not elsewhere classified: Secondary | ICD-10-CM

## 2021-06-25 NOTE — Therapy (Signed)
[name], [name], [address] Phone: [phone]   Fax:  [phone]  Physical Therapy Treatment  Patient Details  Name: Tara Santiago MRN: [id number] Date of Birth: [birth year] Referring Provider (PT): Larena Glassman PHYSICAL THERAPY DISCHARGE SUMMARY  Visits from Start of Care: 10  Current functional level related to goals / functional outcomes: PT ambulating without assistive device ; has returned to work    Remaining deficits: ROM and strength but therapist feels that these are chronic as RT is equal to left.    Education / Equipment: To work on heel raises    Patient agrees to discharge. Patient goals were met. Patient is being discharged due to meeting the stated rehab goals.   Encounter Date: 06/25/2021   PT End of Session - 06/25/21 1656     Visit Number 10    Number of Visits 10    Date for PT Re-Evaluation 07/02/21    Authorization Type BCBS no auth, VL 30, 0 used    Authorization - Visit Number 10    Authorization - Number of Visits 30    Progress Note Due on Visit 10    PT Start Time 3267    PT Stop Time 1655    PT Time Calculation (min) 40 min    Equipment Utilized During Treatment --   SBA   Activity Tolerance Patient tolerated treatment well    Behavior During Therapy WFL for tasks assessed/performed             Past Medical History:  Diagnosis Date   Diabetes mellitus without complication ([place])    Hypertension    Thyroid disease     Past Surgical History:  Procedure Laterality Date   NO PAST SURGERIES     ORIF ANKLE FRACTURE Right 02/21/2021   Procedure: OPEN REDUCTION INTERNAL FIXATION (ORIF) ANKLE FRACTURE;  Surgeon: Mordecai Rasmussen, MD;  Location: [facility];  Service: Orthopedics;  Laterality: Right;    There were no vitals filed for this visit.   Subjective Assessment - 06/25/21 1621     Subjective PT states she had to put a bandaid on it.  She is back to work and she is  not having any pain.    Pertinent History DB    How long can you sit comfortably? no problem    How long can you stand comfortably? able to stand for 20 mintues    How long can you walk comfortably? 30 minutes    Currently in Pain? No/denies    Pain Onset More than a month ago                [facility] PT Assessment - 06/25/21 0001       Assessment   Medical Diagnosis s/p fixation of right trimalleolar ankle fracture    Referring Provider (PT) Larena Glassman    Onset Date/Surgical Date 02/21/21    Next MD Visit 08/20/2021    Prior Therapy no      Precautions   Required Braces or Orthoses --   CAM boot with weight bearing     Restrictions   Weight Bearing Restrictions Yes    RLE Weight Bearing Weight bearing as tolerated   in CAM [place] residence    Living Arrangements Spouse/significant other    Available Help at Discharge Family    Type of [place]  entrance    [place] - 2 wheels;Wheelchair - manual;Cane - single point;Shower seat;Grab bars - toilet      Prior Function   Level of Independence Independent    Vocation Full time employment    Vocation Requirements caregiver      Cognition   Overall Cognitive Status Within Functional Limits for tasks assessed      Observation/Other Assessments   Observations swelling noted thoughout right lower leg and ankle, medial incision open and with slough (see pictures) overall leg dirty    Focus on Therapeutic Outcomes (FOTO)  67% function was 35% function      AROM   Right Ankle Dorsiflexion 8   was0 Lt is 10   Right Ankle Plantar Flexion 45   was 35; Lt 50   Right Ankle Inversion 10   was 0 ; LT 13   Right Ankle Eversion 8   was 8 ; LT is 8     Strength   Right Ankle Dorsiflexion 4+/5    Right Ankle Plantar Flexion 3-/5   was 3-   Right Ankle Inversion 4/5   was 2+   Right Ankle Eversion 4/5   was 2+      Transfers   Sit to Stand --   bent forward     Ambulation/Gait   Ambulation/Gait Yes    Ambulation/Gait Assistance 5: Supervision    Ambulation Distance (Feet) 416 Feet    Assistive device None    Gait Pattern --   in CAM boot   Stairs Yes    Stairs Assistance 6: Modified independent (Device/Increase time)    Stair Management Technique One rail Right;Alternating pattern    Number of Stairs 12    Height of Stairs 7    Gait Comments 2 minute walk test                                     PT Short Term Goals - 06/25/21 1644       PT SHORT TERM GOAL #1   Title Wound will be completely healed    Time 3    Period Weeks    Status Achieved      PT SHORT TERM GOAL #2   Title Patient will report at least 25% improvement in overall symptoms and/or function to demonstrate improved functional mobility    Time 4    Period Weeks    Status Achieved      PT SHORT TERM GOAL #3   Title Patient will be able to ambulate at least 226 feet in 2 minutes with CAM boot and RW to demonstrate improved walking endurance    Status Achieved               PT Long Term Goals - 06/25/21 1646       PT LONG TERM GOAL #1   Title Patient will report at least 50% improvement in overall symptoms and/or function to demonstrate improved functional mobility    Time 8    Period Weeks    Status Achieved      PT LONG TERM GOAL #2   Title Patient will be able to ascend and descend stairs with use of railing as needed without boot and step through gait pattern (per MD approval) to demonstrate improved stair mechanics.    Time 8    Period Weeks  Status On-going      PT LONG TERM GOAL #3   Title Patient will be able to ambulate at least 226 feet in regular shoe and no assistive device (per MD approval) to improve overall walking mobility    Time 8    Period Weeks    Status Achieved      PT LONG TERM GOAL #4   Title Patient will improve on FOTO score to meet predicted  outcomes to demonstrate improved functional mobility.    Time 8    Period Weeks    Status Achieved                   Plan - 06/25/21 1656     Clinical Impression Statement PT reassessed.  Wound is healed.  PT has ROM deficits but Rt ankle ROM is comparable with LT. Pt has strength deficits but Rt ankle strength deficits are equal to Left.  PT has met all goals and feel that she is able to be discharged to a HEP at this time.    Personal Factors and Comorbidities Comorbidity 1    Comorbidities DB    Examination-Activity Limitations Bathing;Squat;Stairs;Stand;Locomotion Level;Lift;Transfers    Examination-Participation Restrictions Meal Prep;Occupation;Driving;Community Activity;Shop;Cleaning    Stability/Clinical Decision Making Evolving/Moderate complexity    Rehab Potential Good    PT Frequency 2x / week    PT Duration 8 weeks    PT Treatment/Interventions ADLs/Self Care Home Management;Cryotherapy;Neuromuscular re-education;Patient/family education;Manual techniques;Dry needling;Passive range of motion;Joint Manipulations;Electrical Stimulation;Therapeutic exercise;Therapeutic activities;Balance training;Functional mobility training;Stair training;Gait training;DME Instruction    PT Next Visit Plan Debride/change wound dressings and strenghtening/stab/ROM of Rt ankle every visit.  complete 10th visit PN next session.    PT Home Exercise Plan ankle pumps, toe extension/flexion; 6/13:  Ankle ROM , sittng heelslide and ankle alphabet exercises  7/5:  seated towel pulls, gastroc stretch with towel; 7/26:  heelraises             Patient will benefit from skilled therapeutic intervention in order to improve the following deficits and impairments:  Pain, Difficulty walking, Decreased mobility, Decreased balance, Decreased range of motion, Decreased strength, Decreased activity tolerance, Decreased knowledge of precautions, Decreased scar mobility, Increased edema, Decreased skin  integrity, Decreased endurance, Abnormal gait  Visit Diagnosis: Muscle weakness (generalized)  Difficulty in walking, not elsewhere classified  Ankle wound, right, initial encounter     Problem List There are no problems to display for this patient.  Rayetta Humphrey, PT CLT [phone]  06/25/2021, 4:59 PM  [facility] [address] [place], [name], [address] Phone: [phone]   Fax:  [phone]  Name: AAIRA OESTREICHER MRN: [id number] Date of Birth: 1971-03-31

## 2021-07-02 ENCOUNTER — Ambulatory Visit (HOSPITAL_COMMUNITY): Payer: BLUE CROSS/BLUE SHIELD | Admitting: Physical Therapy

## 2021-08-20 ENCOUNTER — Encounter: Payer: Self-pay | Admitting: Orthopedic Surgery

## 2021-08-20 ENCOUNTER — Ambulatory Visit: Payer: BLUE CROSS/BLUE SHIELD

## 2021-08-20 ENCOUNTER — Ambulatory Visit: Payer: BLUE CROSS/BLUE SHIELD | Admitting: Orthopedic Surgery

## 2021-08-20 ENCOUNTER — Other Ambulatory Visit: Payer: Self-pay

## 2021-08-20 VITALS — Ht 67.0 in | Wt 241.0 lb

## 2021-08-20 DIAGNOSIS — S82891A Other fracture of right lower leg, initial encounter for closed fracture: Secondary | ICD-10-CM | POA: Diagnosis not present

## 2021-08-20 NOTE — Progress Notes (Signed)
Orthopaedic Postop Note  Assessment: Tara Santiago is a 50 y.o. female s/p ORIF of Right ankle fracture  DOS: 02/21/2021  Plan: Overall, she is doing well. No concerns at this time.  Medial wound is healing slowly, likely secondary to her diabetes.  No restrictions at this time.  Contact the clinic with questions.  Follow up around 1 year from surgery.   Follow-up: Return in about 6 months (around 02/17/2022). XR at next visit: Right ankle  Subjective:  Chief Complaint  Patient presents with   Routine Post Op    Rt ankle DOS 02/21/21    History of Present Illness: Tara Santiago is a 50 y.o. female who presents following the above stated procedure.  She is 6 months out from surgery.  She denies pain in her ankle.  She is pleased with her progress.  Medial ankle wound continues to heal. She is using lotion and cocoa butter on incision and her ankle.  She is using a regular shoe.   Review of Systems: No fevers or chills No numbness or tingling No Chest Pain No shortness of breath   Objective: Ht 5' 7"  (1.702 m)   Wt 241 lb (109.3 kg)   BMI 37.75 kg/m   Physical Exam:  Alert and oriented, no acute distress  Ambulating without an assistive device.   Medial wound continues to heal.  Residual scab, but no concern for an infection.  Lateral incision is well healed.  No surrounding erythema or drainage. Toes are warm and well perfused.  Active TA and EHL.  10 degrees of dorsiflexion with knee flexed.   IMAGING: I personally ordered and reviewed the following images   XR of the right ankle was obtained today and compared to previous XR.  No evidence of hardware failure or loosening.  Mortise is intact.  No syndesmotic disruption.    Impression: Healing right ankle fracture following operative fixation    Mordecai Rasmussen, MD 08/20/2021 11:05 AM

## 2021-11-21 ENCOUNTER — Ambulatory Visit: Payer: BLUE CROSS/BLUE SHIELD | Admitting: Nurse Practitioner

## 2021-12-27 ENCOUNTER — Other Ambulatory Visit: Payer: Self-pay

## 2021-12-27 ENCOUNTER — Ambulatory Visit: Payer: 59 | Admitting: Internal Medicine

## 2021-12-27 ENCOUNTER — Encounter: Payer: Self-pay | Admitting: Internal Medicine

## 2021-12-27 VITALS — BP 118/68 | HR 79 | Resp 20 | Ht 66.0 in | Wt 304.1 lb

## 2021-12-27 DIAGNOSIS — E782 Mixed hyperlipidemia: Secondary | ICD-10-CM | POA: Insufficient documentation

## 2021-12-27 DIAGNOSIS — E1169 Type 2 diabetes mellitus with other specified complication: Secondary | ICD-10-CM | POA: Insufficient documentation

## 2021-12-27 DIAGNOSIS — E1165 Type 2 diabetes mellitus with hyperglycemia: Secondary | ICD-10-CM

## 2021-12-27 DIAGNOSIS — I10 Essential (primary) hypertension: Secondary | ICD-10-CM

## 2021-12-27 DIAGNOSIS — E0590 Thyrotoxicosis, unspecified without thyrotoxic crisis or storm: Secondary | ICD-10-CM | POA: Diagnosis not present

## 2021-12-27 DIAGNOSIS — Z114 Encounter for screening for human immunodeficiency virus [HIV]: Secondary | ICD-10-CM

## 2021-12-27 DIAGNOSIS — Z1211 Encounter for screening for malignant neoplasm of colon: Secondary | ICD-10-CM

## 2021-12-27 DIAGNOSIS — Z1159 Encounter for screening for other viral diseases: Secondary | ICD-10-CM

## 2021-12-27 DIAGNOSIS — Z794 Long term (current) use of insulin: Secondary | ICD-10-CM

## 2021-12-27 DIAGNOSIS — N3941 Urge incontinence: Secondary | ICD-10-CM | POA: Insufficient documentation

## 2021-12-27 DIAGNOSIS — N926 Irregular menstruation, unspecified: Secondary | ICD-10-CM | POA: Diagnosis not present

## 2021-12-27 MED ORDER — METFORMIN HCL 1000 MG PO TABS: 1000.0000 mg | ORAL_TABLET | Freq: Two times a day (BID) | ORAL | 1 refills | Status: DC

## 2021-12-27 MED ORDER — NOVOLIN 70/30 RELION (70-30) 100 UNIT/ML ~~LOC~~ SUSP: 20.0000 [IU] | Freq: Two times a day (BID) | SUBCUTANEOUS | 0 refills | Status: DC

## 2021-12-27 MED ORDER — TIRZEPATIDE 2.5 MG/0.5ML ~~LOC~~ SOAJ: 2.5000 mg | SUBCUTANEOUS | 0 refills | Status: DC

## 2021-12-27 MED ORDER — GLIPIZIDE 10 MG PO TABS: 10.0000 mg | ORAL_TABLET | Freq: Two times a day (BID) | ORAL | 1 refills | Status: DC

## 2021-12-27 MED ORDER — LISINOPRIL 2.5 MG PO TABS: 2.5000 mg | ORAL_TABLET | Freq: Every day | ORAL | 1 refills | Status: DC

## 2021-12-27 MED ORDER — NORETHINDRONE 0.35 MG PO TABS: 1.0000 | ORAL_TABLET | Freq: Every day | ORAL | 11 refills | Status: DC

## 2021-12-27 MED ORDER — OXYBUTYNIN CHLORIDE ER 10 MG PO TB24: 10.0000 mg | ORAL_TABLET | Freq: Every day | ORAL | 1 refills | Status: DC

## 2021-12-27 NOTE — Assessment & Plan Note (Signed)
BP Readings from Last 1 Encounters:  12/27/21 118/68   Well-controlled with Lisinopril 2.5 mg QD and Metoprolol 50 mg BID Would prefer to increase dose of Lisinopril and DC Metoprolol in future visits Counseled for compliance with the medications Advised DASH diet and moderate exercise/walking, at least 150 mins/week

## 2021-12-27 NOTE — Assessment & Plan Note (Signed)
Lab Results  Component Value Date   HGBA1C 6.7 (H) 02/20/2021    On metformin 1000 mg twice daily, glipizide 20 mg twice daily and NovoLog 70/30- 35 units twice daily She takes NovoLog 70/30 intermittently Started Mounjaro, increased dose as tolerated Decreased Glipizide to 10 mg BID Decreased NovoLog 70/30 dose to 20 units twice daily and advised to take it regularly Advised to bring glucose meter and logs in the next visit Advised to follow diabetic diet On ACEi, not on statin-plan to add statin based on lipid profile F/u CMP and lipid panel Diabetic foot exam: Today Diabetic eye exam: Advised to follow up with Ophthalmology for diabetic eye exam

## 2021-12-27 NOTE — Patient Instructions (Signed)
Please start taking Glipizide 10 mg twice daily and Novolog 20 U twice daily.  Please start taking Mounjaro as prescribed.  Please continue to follow low carb diet and ambulate as tolerated.

## 2021-12-27 NOTE — Progress Notes (Signed)
New Patient Office Visit  Subjective:  Patient ID: Tara Santiago, female    DOB: 1971/11/12  Age: 51 y.o. MRN: [id number]  CC:  Chief Complaint  Patient presents with   New Patient (Initial Visit)    New patient was being seen at [place]     HPI Tara Santiago is a 51 y.o. female with past medical history of HTN, type II DM and urge incontinence who presents for establishing care.  HTN: BP is well-controlled. Takes medications regularly. Patient denies headache, dizziness, chest pain, dyspnea or palpitations.  Type II DM: She has been taking metformin, glipizide and Novolin 70/30.  She checks her blood glucose in the morning and sometimes in the evening.  She takes metformin and glipizide regularly.  She admits that she takes Novolin 70/30 irregularly, she omits evening dose sometimes when her blood glucose is less than 150.  On an average, she states that she takes it only once daily half of the days in a week.  Her last Hgb A1c was 6.7 in 03/22.  She denies any episode of hypoglycemia.  She has not brought her blood glucose meter or logs today.  She denies any polyuria or polydipsia currently.  She reports that she had lost about 60 pounds with diet modification and exercise, but gained it back when she had ankle fracture and was not able to walk due to it.  She takes oxybutynin for urge incontinence.  She takes Micronor for irregular menstrual bleeding.  She denies any dysuria, hematuria or vaginal discharge currently.  She has had COVID and flu vaccines.   Past Medical History:  Diagnosis Date   Diabetes mellitus without complication ([place])    Hypertension    Thyroid disease     Past Surgical History:  Procedure Laterality Date   NO PAST SURGERIES     ORIF ANKLE FRACTURE Right 02/21/2021   Procedure: OPEN REDUCTION INTERNAL FIXATION (ORIF) ANKLE FRACTURE;  Surgeon: Mordecai Rasmussen, MD;  Location: [facility];  Service: Orthopedics;  Laterality: Right;     History reviewed. No pertinent family history.  Social History   Socioeconomic History   Marital status: Married    Spouse name: Not on file   Number of children: Not on file   Years of education: Not on file   Highest education level: Not on file  Occupational History   Not on file  Tobacco Use   Smoking status: Never   Smokeless tobacco: Never  Substance and Sexual Activity   Alcohol use: Never   Drug use: Never   Sexual activity: Not on file  Other Topics Concern   Not on file  Social History Narrative   Not on file   Social Determinants of Health   Financial Resource Strain: Not on file  Food Insecurity: Not on file  Transportation Needs: Not on file  Physical Activity: Not on file  Stress: Not on file  Social Connections: Not on file  Intimate Partner Violence: Not on file    ROS Review of Systems  Constitutional:  Negative for chills and fever.  HENT:  Negative for congestion, sinus pressure, sinus pain and sore throat.   Eyes:  Negative for pain and discharge.  Respiratory:  Negative for cough and shortness of breath.   Cardiovascular:  Negative for chest pain and palpitations.  Gastrointestinal:  Negative for abdominal pain, constipation, diarrhea, nausea and vomiting.  Endocrine: Negative for polydipsia and polyuria.  Genitourinary:  Negative for dysuria  and hematuria.  Musculoskeletal:  Negative for neck pain and neck stiffness.  Skin:  Negative for rash.  Neurological:  Negative for dizziness and weakness.  Psychiatric/Behavioral:  Negative for agitation and behavioral problems.    Objective:   Today's Vitals: BP 118/68 (BP Location: Left Arm, Patient Position: Sitting, Cuff Size: Normal)    Pulse 79    Resp 20    Ht 5' 6"  (1.676 m)    Wt (!) 304 lb 1.3 oz (137.9 kg)    SpO2 98%    BMI 49.08 kg/m   Physical Exam Vitals reviewed.  Constitutional:      General: She is not in acute distress.    Appearance: She is obese. She is not diaphoretic.   HENT:     Head: Normocephalic and atraumatic.     Nose: Nose normal.     Mouth/Throat:     Mouth: Mucous membranes are moist.  Eyes:     General: No scleral icterus.    Extraocular Movements: Extraocular movements intact.  Cardiovascular:     Rate and Rhythm: Normal rate and regular rhythm.     Pulses: Normal pulses.     Heart sounds: Normal heart sounds. No murmur heard. Pulmonary:     Breath sounds: Normal breath sounds. No wheezing or rales.  Abdominal:     Palpations: Abdomen is soft.     Tenderness: There is no abdominal tenderness.  Musculoskeletal:     Cervical back: Neck supple. No tenderness.     Right lower leg: No edema.     Left lower leg: No edema.  Skin:    General: Skin is warm.     Findings: No rash.  Neurological:     General: No focal deficit present.     Mental Status: She is alert and oriented to person, place, and time.     Sensory: No sensory deficit.     Motor: No weakness.  Psychiatric:        Mood and Affect: Mood normal.        Behavior: Behavior normal.    Assessment & Plan:   Problem List Items Addressed This Visit       Cardiovascular and Mediastinum   Essential hypertension    BP Readings from Last 1 Encounters:  12/27/21 118/68  Well-controlled with Lisinopril 2.5 mg QD and Metoprolol 50 mg BID Would prefer to increase dose of Lisinopril and DC Metoprolol in future visits Counseled for compliance with the medications Advised DASH diet and moderate exercise/walking, at least 150 mins/week       Relevant Medications   lisinopril (ZESTRIL) 2.5 MG tablet   Other Relevant Orders   CBC with Differential/Platelet     Endocrine   Type 2 diabetes mellitus with hyperglycemia, with long-term current use of insulin (HCC) - Primary    Lab Results  Component Value Date   HGBA1C 6.7 (H) 02/20/2021   On metformin 1000 mg twice daily, glipizide 20 mg twice daily and NovoLog 70/30- 35 units twice daily She takes NovoLog 70/30  intermittently Started Mounjaro, increased dose as tolerated Decreased Glipizide to 10 mg BID Decreased NovoLog 70/30 dose to 20 units twice daily and advised to take it regularly Advised to bring glucose meter and logs in the next visit Advised to follow diabetic diet On ACEi, not on statin-plan to add statin based on lipid profile F/u CMP and lipid panel Diabetic foot exam: Today Diabetic eye exam: Advised to follow up with Ophthalmology for diabetic eye exam  Relevant Medications   tirzepatide (MOUNJARO) 2.5 MG/0.5ML Pen   NOVOLIN 70/30 RELION (70-30) 100 UNIT/ML injection   lisinopril (ZESTRIL) 2.5 MG tablet   metFORMIN (GLUCOPHAGE) 1000 MG tablet   glipiZIDE (GLUCOTROL) 10 MG tablet   Other Relevant Orders   CMP14+EGFR   Hemoglobin A1c   Lipid Profile   Hyperthyroidism    Unclear history of thyroid disorder Was started on levothyroxine initially, and was later switched to methimazole Currently has run out of methimazole Check TSH and free T4      Relevant Medications   methimazole (TAPAZOLE) 5 MG tablet   Other Relevant Orders   TSH + free T4     Other   Irregular menstrual bleeding    Takes Micronor daily, refilled      Relevant Medications   norethindrone (MICRONOR) 0.35 MG tablet   Other Relevant Orders   CBC with Differential/Platelet   Mixed hyperlipidemia    Check lipid profile Plan to add statin      Relevant Medications   lisinopril (ZESTRIL) 2.5 MG tablet   Urgency incontinence    Well controlled with oxybutynin      Relevant Medications   oxybutynin (DITROPAN-XL) 10 MG 24 hr tablet   Other Visit Diagnoses     Need for hepatitis C screening test       Relevant Orders   Hepatitis C Antibody   Special screening for malignant neoplasms, colon       Relevant Orders   Cologuard   Encounter for screening for HIV       Relevant Orders   HIV antibody (with reflex)       Outpatient Encounter Medications as of 12/27/2021  Medication  Sig   APPLE CIDER VINEGAR PO Take 450 mg by mouth daily.   Cholecalciferol (VITAMIN D) 125 MCG (5000 UT) CAPS Take 5,000 Units by mouth daily.   methimazole (TAPAZOLE) 5 MG tablet Take 5 mg by mouth 2 (two) times daily.   metoprolol tartrate (LOPRESSOR) 50 MG tablet Take 50 mg by mouth 2 (two) times daily.   tirzepatide [facility]) 2.5 MG/0.5ML Pen Inject 2.5 mg into the skin once a week.   [DISCONTINUED] glipiZIDE (GLUCOTROL) 10 MG tablet Take 10 mg by mouth 2 (two) times daily.   [DISCONTINUED] lisinopril (ZESTRIL) 2.5 MG tablet Take 2.5 mg by mouth daily.   [DISCONTINUED] metFORMIN (GLUCOPHAGE) 1000 MG tablet Take 1,000 mg by mouth 2 (two) times daily.   [DISCONTINUED] norethindrone (MICRONOR) 0.35 MG tablet Take 1 tablet by mouth daily.   [DISCONTINUED] NOVOLIN 70/30 RELION (70-30) 100 UNIT/ML injection Inject 35 Units into the skin 2 (two) times daily with a meal.   [DISCONTINUED] oxybutynin (DITROPAN-XL) 10 MG 24 hr tablet Take 10 mg by mouth daily.   glipiZIDE (GLUCOTROL) 10 MG tablet Take 1 tablet (10 mg total) by mouth 2 (two) times daily.   lisinopril (ZESTRIL) 2.5 MG tablet Take 1 tablet (2.5 mg total) by mouth daily.   metFORMIN (GLUCOPHAGE) 1000 MG tablet Take 1 tablet (1,000 mg total) by mouth 2 (two) times daily.   norethindrone (MICRONOR) 0.35 MG tablet Take 1 tablet (0.35 mg total) by mouth daily.   NOVOLIN 70/30 RELION (70-30) 100 UNIT/ML injection Inject 20 Units into the skin 2 (two) times daily with a meal.   oxybutynin (DITROPAN-XL) 10 MG 24 hr tablet Take 1 tablet (10 mg total) by mouth daily.   [DISCONTINUED] levothyroxine (SYNTHROID) 25 MCG tablet Take 25 mcg by mouth daily before breakfast. (Patient not taking:  Reported on 12/27/2021)   No facility-administered encounter medications on file as of 12/27/2021.    Follow-up: Return in about 1 month (around 01/27/2022) for DM.   Lindell Spar, MD

## 2021-12-27 NOTE — Assessment & Plan Note (Signed)
Check lipid profile Plan to add statin

## 2021-12-27 NOTE — Assessment & Plan Note (Signed)
Well controlled with oxybutynin

## 2021-12-27 NOTE — Assessment & Plan Note (Signed)
Unclear history of thyroid disorder Was started on levothyroxine initially, and was later switched to methimazole Currently has run out of methimazole Check TSH and free T4

## 2021-12-27 NOTE — Assessment & Plan Note (Signed)
Takes Micronor daily, refilled

## 2021-12-28 LAB — CBC WITH DIFFERENTIAL/PLATELET
Basophils Absolute: 0.1 10*3/uL (ref 0.0–0.2)
Basos: 1 %
EOS (ABSOLUTE): 0.3 10*3/uL (ref 0.0–0.4)
Eos: 2 %
Hematocrit: 42.3 % (ref 34.0–46.6)
Hemoglobin: 13.9 g/dL (ref 11.1–15.9)
Immature Grans (Abs): 0.1 10*3/uL (ref 0.0–0.1)
Immature Granulocytes: 1 %
Lymphocytes Absolute: 1.2 10*3/uL (ref 0.7–3.1)
Lymphs: 12 %
MCH: 28.6 pg (ref 26.6–33.0)
MCHC: 32.9 g/dL (ref 31.5–35.7)
MCV: 87 fL (ref 79–97)
Monocytes Absolute: 0.5 10*3/uL (ref 0.1–0.9)
Monocytes: 5 %
Neutrophils Absolute: 8.1 10*3/uL — ABNORMAL HIGH (ref 1.4–7.0)
Neutrophils: 79 %
Platelets: 346 10*3/uL (ref 150–450)
RBC: 4.86 x10E6/uL (ref 3.77–5.28)
RDW: 12.7 % (ref 11.7–15.4)
WBC: 10.3 10*3/uL (ref 3.4–10.8)

## 2021-12-28 LAB — HIV ANTIBODY (ROUTINE TESTING W REFLEX): HIV Screen 4th Generation wRfx: NONREACTIVE

## 2021-12-28 LAB — LIPID PANEL
Chol/HDL Ratio: 3.2 ratio (ref 0.0–4.4)
Cholesterol, Total: 128 mg/dL (ref 100–199)
HDL: 40 mg/dL (ref >39)
LDL Chol Calc (NIH): 75 mg/dL (ref 0–99)
Triglycerides: 64 mg/dL (ref 0–149)
VLDL Cholesterol Cal: 13 mg/dL (ref 5–40)

## 2021-12-28 LAB — CMP14+EGFR
ALT: 17 IU/L (ref 0–32)
AST: 18 IU/L (ref 0–40)
Albumin/Globulin Ratio: 1.6 (ref 1.2–2.2)
Albumin: 4.5 g/dL (ref 3.8–4.8)
Alkaline Phosphatase: 115 IU/L (ref 44–121)
BUN/Creatinine Ratio: 13 (ref 9–23)
BUN: 7 mg/dL (ref 6–24)
Bilirubin Total: 0.5 mg/dL (ref 0.0–1.2)
CO2: 22 mmol/L (ref 20–29)
Calcium: 9.5 mg/dL (ref 8.7–10.2)
Chloride: 100 mmol/L (ref 96–106)
Creatinine, Ser: 0.56 mg/dL — ABNORMAL LOW (ref 0.57–1.00)
Globulin, Total: 2.8 g/dL (ref 1.5–4.5)
Glucose: 148 mg/dL — ABNORMAL HIGH (ref 70–99)
Potassium: 4.9 mmol/L (ref 3.5–5.2)
Sodium: 138 mmol/L (ref 134–144)
Total Protein: 7.3 g/dL (ref 6.0–8.5)
eGFR: 112 mL/min/{1.73_m2} (ref >59)

## 2021-12-28 LAB — HEPATITIS C ANTIBODY: Hep C Virus Ab: <0.1 s/co ratio (ref 0.0–0.9)

## 2021-12-28 LAB — TSH+FREE T4
Free T4: 1.28 ng/dL (ref 0.82–1.77)
TSH: 3.8 u[IU]/mL (ref 0.450–4.500)

## 2021-12-28 LAB — HEMOGLOBIN A1C
Est. average glucose Bld gHb Est-mCnc: 186 mg/dL
Hgb A1c MFr Bld: 8.1 % — ABNORMAL HIGH (ref 4.8–5.6)

## 2022-01-14 LAB — COLOGUARD: COLOGUARD: NEGATIVE

## 2022-01-28 ENCOUNTER — Other Ambulatory Visit: Payer: Self-pay

## 2022-01-28 ENCOUNTER — Encounter (INDEPENDENT_AMBULATORY_CARE_PROVIDER_SITE_OTHER): Payer: Self-pay

## 2022-01-28 ENCOUNTER — Ambulatory Visit: Payer: 59 | Admitting: Internal Medicine

## 2022-01-28 ENCOUNTER — Ambulatory Visit: Payer: 59 | Admitting: *Deleted

## 2022-01-28 ENCOUNTER — Telehealth: Payer: Self-pay

## 2022-01-28 ENCOUNTER — Encounter: Payer: Self-pay | Admitting: Internal Medicine

## 2022-01-28 VITALS — BP 122/82 | HR 88 | Resp 18 | Ht 66.0 in | Wt 293.6 lb

## 2022-01-28 DIAGNOSIS — E782 Mixed hyperlipidemia: Secondary | ICD-10-CM

## 2022-01-28 DIAGNOSIS — Z794 Long term (current) use of insulin: Secondary | ICD-10-CM

## 2022-01-28 DIAGNOSIS — N3941 Urge incontinence: Secondary | ICD-10-CM | POA: Diagnosis not present

## 2022-01-28 DIAGNOSIS — I10 Essential (primary) hypertension: Secondary | ICD-10-CM

## 2022-01-28 DIAGNOSIS — E1165 Type 2 diabetes mellitus with hyperglycemia: Secondary | ICD-10-CM | POA: Diagnosis not present

## 2022-01-28 LAB — HM DIABETES EYE EXAM

## 2022-01-28 MED ORDER — LANTUS SOLOSTAR 100 UNIT/ML ~~LOC~~ SOPN: 30.0000 [IU] | PEN_INJECTOR | Freq: Every day | SUBCUTANEOUS | 2 refills | Status: DC

## 2022-01-28 MED ORDER — TRESIBA FLEXTOUCH 100 UNIT/ML ~~LOC~~ SOPN: 30.0000 [IU] | PEN_INJECTOR | Freq: Every day | SUBCUTANEOUS | 3 refills | Status: DC

## 2022-01-28 MED ORDER — TIRZEPATIDE 5 MG/0.5ML ~~LOC~~ SOAJ: 5.0000 mg | SUBCUTANEOUS | 0 refills | Status: DC

## 2022-01-28 NOTE — Telephone Encounter (Signed)
Patient said can something else be called into pharmacy cheaper the insulin insulin degludec (TRESIBA FLEXTOUCH) 100 UNIT/ML FlexTouch Pen that was called in cost was $60.00. Needs something cheapier.  Pharmacy: [facility]

## 2022-01-28 NOTE — Assessment & Plan Note (Signed)
Currently well controlled Will add statin if LDL increases

## 2022-01-28 NOTE — Progress Notes (Signed)
Established Patient Office Visit  Subjective:  Patient ID: Tara Santiago, female    DOB: 1971-01-19  Age: 51 y.o. MRN: [id number]  CC:  Chief Complaint  Patient presents with   Follow-up    1 follow up DM mounjaro is working     HPI SPRUHA WEIGHT is a 51 y.o. female with past medical history of HTN, type II DM and urge incontinence who presents for f/u of her chronic medical conditions.  HTN: BP is well-controlled. Takes medications regularly. Patient denies headache, dizziness, chest pain, dyspnea or palpitations.  Type II DM: She has started taking Mounjaro and has been tolerating it well.  She continues to take metformin, glipizide and Novolin 70/30 -20 units twice daily.  She has brought her blood glucose logs today, which show blood glucose in the range of 90-150 most of the time, with few readings above 150.  She has lost about 10 lbs since the last visit.  She denies any polyuria or polydipsia currently.  She agrees to switch her Novolin to long-acting insulin now.  She takes oxybutynin for urge incontinence.  She takes Micronor for irregular menstrual bleeding.  She denies any dysuria, hematuria or vaginal discharge currently.  Past Medical History:  Diagnosis Date   Diabetes mellitus without complication ([place])    Hypertension    Thyroid disease     Past Surgical History:  Procedure Laterality Date   NO PAST SURGERIES     ORIF ANKLE FRACTURE Right 02/21/2021   Procedure: OPEN REDUCTION INTERNAL FIXATION (ORIF) ANKLE FRACTURE;  Surgeon: Mordecai Rasmussen, MD;  Location: [facility];  Service: Orthopedics;  Laterality: Right;    History reviewed. No pertinent family history.  Social History   Socioeconomic History   Marital status: Married    Spouse name: Not on file   Number of children: Not on file   Years of education: Not on file   Highest education level: Not on file  Occupational History   Not on file  Tobacco Use   Smoking status: Never   Smokeless tobacco:  Never  Substance and Sexual Activity   Alcohol use: Never   Drug use: Never   Sexual activity: Not on file  Other Topics Concern   Not on file  Social History Narrative   Not on file   Social Determinants of Health   Financial Resource Strain: Not on file  Food Insecurity: Not on file  Transportation Needs: Not on file  Physical Activity: Not on file  Stress: Not on file  Social Connections: Not on file  Intimate Partner Violence: Not on file    Outpatient Medications Prior to Visit  Medication Sig Dispense Refill   APPLE CIDER VINEGAR PO Take 450 mg by mouth daily.     Cholecalciferol (VITAMIN D) 125 MCG (5000 UT) CAPS Take 5,000 Units by mouth daily.     glipiZIDE (GLUCOTROL) 10 MG tablet Take 1 tablet (10 mg total) by mouth 2 (two) times daily. 90 tablet 1   lisinopril (ZESTRIL) 2.5 MG tablet Take 1 tablet (2.5 mg total) by mouth daily. 90 tablet 1   metFORMIN (GLUCOPHAGE) 1000 MG tablet Take 1 tablet (1,000 mg total) by mouth 2 (two) times daily. 90 tablet 1   metoprolol tartrate (LOPRESSOR) 50 MG tablet Take 50 mg by mouth 2 (two) times daily.     norethindrone (MICRONOR) 0.35 MG tablet Take 1 tablet (0.35 mg total) by mouth daily. 28 tablet 11   oxybutynin (DITROPAN-XL) 10 MG  24 hr tablet Take 1 tablet (10 mg total) by mouth daily. 90 tablet 1   methimazole (TAPAZOLE) 5 MG tablet Take 5 mg by mouth 2 (two) times daily.     NOVOLIN 70/30 RELION (70-30) 100 UNIT/ML injection Inject 20 Units into the skin 2 (two) times daily with a meal. 10 mL 0   tirzepatide (MOUNJARO) 2.5 MG/0.5ML Pen Inject 2.5 mg into the skin once a week. 2 mL 0   No facility-administered medications prior to visit.    Allergies  Allergen Reactions   Peanut-Containing Drug Products Hives    Patient reports dizzy and passes out.    ROS Review of Systems  Constitutional:  Negative for chills and fever.  HENT:  Negative for congestion, sinus pressure, sinus pain and sore throat.   Eyes:  Negative  for pain and discharge.  Respiratory:  Negative for cough and shortness of breath.   Cardiovascular:  Negative for chest pain and palpitations.  Gastrointestinal:  Negative for abdominal pain, constipation, diarrhea, nausea and vomiting.  Endocrine: Negative for polydipsia and polyuria.  Genitourinary:  Negative for dysuria and hematuria.  Musculoskeletal:  Negative for neck pain and neck stiffness.  Skin:  Negative for rash.  Neurological:  Negative for dizziness and weakness.  Psychiatric/Behavioral:  Negative for agitation and behavioral problems.      Objective:    Physical Exam Vitals reviewed.  Constitutional:      General: She is not in acute distress.    Appearance: She is obese. She is not diaphoretic.  HENT:     Head: Normocephalic and atraumatic.     Nose: Nose normal.     Mouth/Throat:     Mouth: Mucous membranes are moist.  Eyes:     General: No scleral icterus.    Extraocular Movements: Extraocular movements intact.  Cardiovascular:     Rate and Rhythm: Normal rate and regular rhythm.     Pulses: Normal pulses.     Heart sounds: Normal heart sounds. No murmur heard. Pulmonary:     Breath sounds: Normal breath sounds. No wheezing or rales.  Musculoskeletal:     Cervical back: Neck supple. No tenderness.     Right lower leg: No edema.     Left lower leg: No edema.  Skin:    General: Skin is warm.     Findings: No rash.  Neurological:     General: No focal deficit present.     Mental Status: She is alert and oriented to person, place, and time.     Sensory: No sensory deficit.     Motor: No weakness.  Psychiatric:        Mood and Affect: Mood normal.        Behavior: Behavior normal.    BP 122/82 (BP Location: Left Arm, Patient Position: Sitting, Cuff Size: Normal)    Pulse 88    Resp 18    Ht 5' 6"  (1.676 m)    Wt 293 lb 9.6 oz (133.2 kg)    SpO2 97%    BMI 47.39 kg/m  Wt Readings from Last 3 Encounters:  01/28/22 293 lb 9.6 oz (133.2 kg)  01/28/22  293 lb 9.6 oz (133.2 kg)  12/27/21 (!) 304 lb 1.3 oz (137.9 kg)    Lab Results  Component Value Date   TSH 3.800 12/27/2021   Lab Results  Component Value Date   WBC 10.3 12/27/2021   HGB 13.9 12/27/2021   HCT 42.3 12/27/2021   MCV 87 12/27/2021  PLT 346 12/27/2021   Lab Results  Component Value Date   NA 138 12/27/2021   K 4.9 12/27/2021   CO2 22 12/27/2021   GLUCOSE 148 (H) 12/27/2021   BUN 7 12/27/2021   CREATININE 0.56 (L) 12/27/2021   BILITOT 0.5 12/27/2021   ALKPHOS 115 12/27/2021   AST 18 12/27/2021   ALT 17 12/27/2021   PROT 7.3 12/27/2021   ALBUMIN 4.5 12/27/2021   CALCIUM 9.5 12/27/2021   ANIONGAP 11 02/20/2021   EGFR 112 12/27/2021   Lab Results  Component Value Date   CHOL 128 12/27/2021   Lab Results  Component Value Date   HDL 40 12/27/2021   Lab Results  Component Value Date   LDLCALC 75 12/27/2021   Lab Results  Component Value Date   TRIG 64 12/27/2021   Lab Results  Component Value Date   CHOLHDL 3.2 12/27/2021   Lab Results  Component Value Date   HGBA1C 8.1 (H) 12/27/2021      Assessment & Plan:   Problem List Items Addressed This Visit       Cardiovascular and Mediastinum   Essential hypertension    BP Readings from Last 1 Encounters:  01/28/22 122/82  Well-controlled with Lisinopril 2.5 mg QD and Metoprolol 50 mg BID Would prefer to increase dose of Lisinopril and DC Metoprolol in future visits Counseled for compliance with the medications Advised DASH diet and moderate exercise/walking, at least 150 mins/week        Endocrine   Type 2 diabetes mellitus with hyperglycemia, with long-term current use of insulin (HCC) - Primary    Lab Results  Component Value Date   HGBA1C 8.1 (H) 12/27/2021   On metformin 1000 mg twice daily, glipizide 10 mg twice daily and NovoLog 70/30- 20 units twice daily On Mounjaro, increased dose as tolerated Switched to Lantus 30 U qHS Advised to bring glucose meter and logs in the next  visit Advised to follow diabetic diet On ACEi, not on statin - LDL 75 currently Diabetic eye exam: Advised to follow up with Ophthalmology for diabetic eye exam       Relevant Medications   tirzepatide (MOUNJARO) 5 MG/0.5ML Pen   insulin glargine (LANTUS SOLOSTAR) 100 UNIT/ML Solostar Pen     Other   Mixed hyperlipidemia    Currently well controlled Will add statin if LDL increases      Urgency incontinence    Well controlled with oxybutynin       Meds ordered this encounter  Medications   tirzepatide (MOUNJARO) 5 MG/0.5ML Pen    Sig: Inject 5 mg into the skin once a week.    Dispense:  6 mL    Refill:  0   DISCONTD: insulin degludec (TRESIBA FLEXTOUCH) 100 UNIT/ML FlexTouch Pen    Sig: Inject 30 Units into the skin at bedtime.    Dispense:  9 mL    Refill:  3   insulin glargine (LANTUS SOLOSTAR) 100 UNIT/ML Solostar Pen    Sig: Inject 30 Units into the skin daily.    Dispense:  15 mL    Refill:  2    Follow-up: Return in about 2 months (around 03/28/2022) for DM.    Lindell Spar, MD

## 2022-01-28 NOTE — Assessment & Plan Note (Signed)
Lab Results  Component Value Date   HGBA1C 8.1 (H) 12/27/2021    On metformin 1000 mg twice daily, glipizide 10 mg twice daily and NovoLog 70/30- 20 units twice daily On Mounjaro, increased dose as tolerated Switched to Lantus 30 U qHS Advised to bring glucose meter and logs in the next visit Advised to follow diabetic diet On ACEi, not on statin - LDL 75 currently Diabetic eye exam: Advised to follow up with Ophthalmology for diabetic eye exam

## 2022-01-28 NOTE — Assessment & Plan Note (Signed)
Well controlled with oxybutynin

## 2022-01-28 NOTE — Assessment & Plan Note (Signed)
BP Readings from Last 1 Encounters:  01/28/22 122/82   Well-controlled with Lisinopril 2.5 mg QD and Metoprolol 50 mg BID Would prefer to increase dose of Lisinopril and DC Metoprolol in future visits Counseled for compliance with the medications Advised DASH diet and moderate exercise/walking, at least 150 mins/week

## 2022-01-28 NOTE — Telephone Encounter (Signed)
Pt notified with verbal understanding

## 2022-01-28 NOTE — Patient Instructions (Signed)
Please start taking Mounjaro 5 mg instead of 2.5 mg.  Please start taking Tresiba 30 U at nighttime instead of Novolog.  Please continue taking other medications as prescribed.

## 2022-01-29 ENCOUNTER — Other Ambulatory Visit: Payer: Self-pay | Admitting: Internal Medicine

## 2022-01-29 ENCOUNTER — Telehealth: Payer: Self-pay | Admitting: Internal Medicine

## 2022-01-29 DIAGNOSIS — E1165 Type 2 diabetes mellitus with hyperglycemia: Secondary | ICD-10-CM

## 2022-01-29 DIAGNOSIS — Z794 Long term (current) use of insulin: Secondary | ICD-10-CM

## 2022-01-29 MED ORDER — NOVOLIN 70/30 (70-30) 100 UNIT/ML ~~LOC~~ SUSP: 20.0000 [IU] | Freq: Two times a day (BID) | SUBCUTANEOUS | 11 refills | Status: DC

## 2022-01-29 NOTE — Telephone Encounter (Signed)
Pt advised with verbal understanding  ?

## 2022-01-29 NOTE — Telephone Encounter (Signed)
Patient called in regard to insulin glargine (LANTUS SOLOSTAR) 100 UNIT/ML Solostar Pen  ? ? ?Patient states med is too expensive for her. ?Patient would like to switch back to a previous and cheaper insulin. ? ?The [facility] is working great and patient is okay with the med.  ? ?Patient would like a call back in regard.  ?

## 2022-02-03 ENCOUNTER — Ambulatory Visit: Payer: BLUE CROSS/BLUE SHIELD | Admitting: Internal Medicine

## 2022-02-04 ENCOUNTER — Other Ambulatory Visit: Payer: Self-pay | Admitting: *Deleted

## 2022-02-04 DIAGNOSIS — N3941 Urge incontinence: Secondary | ICD-10-CM

## 2022-02-04 MED ORDER — OXYBUTYNIN CHLORIDE ER 10 MG PO TB24: 10.0000 mg | ORAL_TABLET | Freq: Every day | ORAL | 1 refills | Status: DC

## 2022-02-04 NOTE — Progress Notes (Signed)
eeeeeeeeeeeeeeeeeeeeeeeeeeeeeeeeeeeeeeeeeeeeee ?

## 2022-02-18 ENCOUNTER — Other Ambulatory Visit: Payer: Self-pay

## 2022-02-18 ENCOUNTER — Ambulatory Visit (INDEPENDENT_AMBULATORY_CARE_PROVIDER_SITE_OTHER): Payer: 59

## 2022-02-18 ENCOUNTER — Encounter: Payer: Self-pay | Admitting: Orthopedic Surgery

## 2022-02-18 ENCOUNTER — Ambulatory Visit: Payer: 59 | Admitting: Orthopedic Surgery

## 2022-02-18 VITALS — Ht 66.0 in | Wt 293.0 lb

## 2022-02-18 DIAGNOSIS — S82891D Other fracture of right lower leg, subsequent encounter for closed fracture with routine healing: Secondary | ICD-10-CM

## 2022-02-18 NOTE — Progress Notes (Signed)
Orthopaedic Postop Note ? ?Assessment: ?Tara Santiago is a 51 y.o. female s/p ORIF of Right ankle fracture ? ?DOS: 02/21/2021 ? ?Plan: ?Tara Santiago is doing very well.  She has no pain in her right ankle.  Incisions are healing well.  She notes some dryness over the medial wound, which is finally healed.  Nothing further is needed at this time.  Follow-up as needed. ? ?Follow-up: ?Return if symptoms worsen or fail to improve. ?XR at next visit: Right ankle ? ?Subjective: ? ?Chief Complaint  ?Patient presents with  ? Routine Post Op  ?  Rt ankle DOS 02/21/21  ? ? ?History of Present Illness: ?Tara Santiago is a 51 y.o. female who presents following the above stated procedure.  Surgery was approximately 1 year ago.  She states she is doing well.  She notes occasional pains or aching sensations within the right ankle, but these are infrequent.  She is not taking medications on a regular basis. ? ? ?Review of Systems: ?No fevers or chills ?No numbness or tingling ?No Chest Pain ?No shortness of breath ? ? ?Objective: ?Ht 5' 6"  (1.676 m)   Wt 293 lb (132.9 kg)   BMI 47.29 kg/m?  ? ?Physical Exam: ? ?Alert and oriented, no acute distress ? ?Ambulating without an assistive device.  Wearing sandals ? ?Medial wound is healed.  There is some residual dry skin, but no tenderness to palpation.  Lateral incision is well healed.  No surrounding erythema or drainage. Toes are warm and well perfused.  Active TA and EHL.  10 degrees of dorsiflexion with knee flexed.  ? ?IMAGING: ?I personally ordered and reviewed the following images  ? ?The right ankle was obtained in clinic today.  There has been no interval displacement.  Hardware remains intact.  No evidence of screws backing out.  The syndesmotic tight rope device remains in a stable position.  The mortise is congruent.  No syndesmotic disruption. ? ?Impression: Healed right ankle fracture, following operative fixation without hardware failure or loosening. ? ? ? ?Mordecai Rasmussen, MD ?02/18/2022 ?12:17 PM ?

## 2022-03-24 ENCOUNTER — Other Ambulatory Visit: Payer: Self-pay | Admitting: *Deleted

## 2022-03-24 MED ORDER — METOPROLOL TARTRATE 50 MG PO TABS: 50.0000 mg | ORAL_TABLET | Freq: Two times a day (BID) | ORAL | 0 refills | Status: DC

## 2022-04-10 ENCOUNTER — Encounter: Payer: Self-pay | Admitting: Internal Medicine

## 2022-04-10 ENCOUNTER — Ambulatory Visit: Payer: 59 | Admitting: Internal Medicine

## 2022-04-10 VITALS — BP 118/72 | HR 80 | Resp 18 | Ht 65.0 in | Wt 283.8 lb

## 2022-04-10 DIAGNOSIS — N3941 Urge incontinence: Secondary | ICD-10-CM | POA: Diagnosis not present

## 2022-04-10 DIAGNOSIS — E782 Mixed hyperlipidemia: Secondary | ICD-10-CM

## 2022-04-10 DIAGNOSIS — I10 Essential (primary) hypertension: Secondary | ICD-10-CM

## 2022-04-10 DIAGNOSIS — Z794 Long term (current) use of insulin: Secondary | ICD-10-CM

## 2022-04-10 DIAGNOSIS — E1165 Type 2 diabetes mellitus with hyperglycemia: Secondary | ICD-10-CM

## 2022-04-10 LAB — POCT GLYCOSYLATED HEMOGLOBIN (HGB A1C): HbA1c, POC (controlled diabetic range): 6.6 % (ref 0.0–7.0)

## 2022-04-10 MED ORDER — TIRZEPATIDE 7.5 MG/0.5ML ~~LOC~~ SOAJ
7.5000 mg | SUBCUTANEOUS | 0 refills | Status: DC
Start: 2022-04-10 — End: 2022-07-15

## 2022-04-10 MED ORDER — DEXCOM G7 SENSOR MISC: 0 refills | Status: DC

## 2022-04-10 MED ORDER — NOVOLIN 70/30 RELION (70-30) 100 UNIT/ML ~~LOC~~ SUSP: 10.0000 [IU] | Freq: Two times a day (BID) | SUBCUTANEOUS | 1 refills | Status: DC

## 2022-04-10 NOTE — Assessment & Plan Note (Addendum)
Lab Results  ?Component Value Date  ? HGBA1C 6.6 04/10/2022  ? ? ?On metformin 1000 mg twice daily, glipizide 10 mg twice daily and NovoLog 70/30- 20 units twice daily ?On Mounjaro, increased dose as tolerated ?Was not able to afford long-acting insulin ?Decreased Novolin 70/30 to 10 U BID ?Advised to bring glucose meter and logs in the next visit ?She has h/o hypoglycemia, prescribed Dexcom ?Advised to follow diabetic diet ?On ACEi, not on statin - LDL 75 currently ?Diabetic eye exam: Advised to follow up with Ophthalmology for diabetic eye exam ?

## 2022-04-10 NOTE — Assessment & Plan Note (Signed)
Currently well controlled ?Will add statin if LDL increases ?

## 2022-04-10 NOTE — Assessment & Plan Note (Signed)
BP Readings from Last 1 Encounters:  ?04/10/22 118/72  ? ?Well-controlled with Lisinopril 2.5 mg QD and Metoprolol 50 mg BID ?Would prefer to increase dose of Lisinopril and DC Metoprolol in future visits ?Counseled for compliance with the medications ?Advised DASH diet and moderate exercise/walking, at least 150 mins/week ?

## 2022-04-10 NOTE — Patient Instructions (Addendum)
Please start taking Mounjaro 7.5 mg instead of 5 mg. ? ?Please take Novolin 70/30 - only 10 U twice daily. ? ?Please contact us if your blood glucose is more than 300 or less than 70. ? ?Please continue to follow low carb diet and ambulate as tolerated. ?

## 2022-04-10 NOTE — Assessment & Plan Note (Signed)
Well controlled with oxybutynin ?

## 2022-04-10 NOTE — Progress Notes (Signed)
? ?Established Patient Office Visit ? ?Subjective:  ?Patient ID: Tara Santiago, female    DOB: 1971-05-07  Age: 51 y.o. MRN: [id number] ? ?CC:  ?Chief Complaint  ?Patient presents with  ? Follow-up  ?  2 month follow up DM   ? ? ?HPI ?Tara Santiago is a 52 y.o. female with past medical history of HTN, type II DM and urge incontinence who presents for f/u of her chronic medical conditions. ? ?HTN: BP is well-controlled. Takes medications regularly. Patient denies headache, dizziness, chest pain, dyspnea or palpitations. ?  ?Type II DM: She has started taking Mounjaro and has been tolerating it well.  She continues to take metformin, glipizide and Novolin 70/30 -20 units twice daily.  She admits that she takes Novolin 70/30 only once on many days as her blood glucose stays around 90s on many days. She denies any episode of hypoglycemia recently, but has had blood glucose in 60s in the past. She has lost about 10 lbs since the last visit.  She denies any polyuria or polydipsia currently. ? ?She takes oxybutynin for urge incontinence.  She takes Micronor for irregular menstrual bleeding.  She denies any dysuria, hematuria or vaginal discharge currently. ? ?Past Medical History:  ?Diagnosis Date  ? Diabetes mellitus without complication ([place])   ? Hypertension   ? Thyroid disease   ? ? ?Past Surgical History:  ?Procedure Laterality Date  ? NO PAST SURGERIES    ? ORIF ANKLE FRACTURE Right 02/21/2021  ? Procedure: OPEN REDUCTION INTERNAL FIXATION (ORIF) ANKLE FRACTURE;  Surgeon: Mordecai Rasmussen, MD;  Location: [facility];  Service: Orthopedics;  Laterality: Right;  ? ? ?History reviewed. No pertinent family history. ? ?Social History  ? ?Socioeconomic History  ? Marital status: Married  ?  Spouse name: Not on file  ? Number of children: Not on file  ? Years of education: Not on file  ? Highest education level: Not on file  ?Occupational History  ? Not on file  ?Tobacco Use  ? Smoking status: Never  ? Smokeless tobacco: Never   ?Substance and Sexual Activity  ? Alcohol use: Never  ? Drug use: Never  ? Sexual activity: Not on file  ?Other Topics Concern  ? Not on file  ?Social History Narrative  ? Not on file  ? ?Social Determinants of Health  ? ?Financial Resource Strain: Not on file  ?Food Insecurity: Not on file  ?Transportation Needs: Not on file  ?Physical Activity: Not on file  ?Stress: Not on file  ?Social Connections: Not on file  ?Intimate Partner Violence: Not on file  ? ? ?Outpatient Medications Prior to Visit  ?Medication Sig Dispense Refill  ? APPLE CIDER VINEGAR PO Take 450 mg by mouth daily.    ? Cholecalciferol (VITAMIN D) 125 MCG (5000 UT) CAPS Take 5,000 Units by mouth daily.    ? glipiZIDE (GLUCOTROL) 10 MG tablet Take 1 tablet (10 mg total) by mouth 2 (two) times daily. 90 tablet 1  ? lisinopril (ZESTRIL) 2.5 MG tablet Take 1 tablet (2.5 mg total) by mouth daily. 90 tablet 1  ? metFORMIN (GLUCOPHAGE) 1000 MG tablet Take 1 tablet (1,000 mg total) by mouth 2 (two) times daily. 90 tablet 1  ? metoprolol tartrate (LOPRESSOR) 50 MG tablet Take 1 tablet (50 mg total) by mouth 2 (two) times daily. 90 tablet 0  ? norethindrone (MICRONOR) 0.35 MG tablet Take 1 tablet (0.35 mg total) by mouth daily. 28 tablet 11  ?  oxybutynin (DITROPAN-XL) 10 MG 24 hr tablet Take 1 tablet (10 mg total) by mouth daily. 90 tablet 1  ? NOVOLIN 70/30 RELION (70-30) 100 UNIT/ML injection INJECT 20 UNITS SUBCUTANEOUSLY TWICE DAILY WITH A MEAL 10 mL 0  ? tirzepatide (MOUNJARO) 5 MG/0.5ML Pen Inject 5 mg into the skin once a week. 6 mL 0  ? ?No facility-administered medications prior to visit.  ? ? ?Allergies  ?Allergen Reactions  ? Peanut-Containing Drug Products Hives  ?  Patient reports dizzy and passes out.  ? ? ?ROS ?Review of Systems  ?Constitutional:  Negative for chills and fever.  ?HENT:  Negative for congestion, sinus pressure, sinus pain and sore throat.   ?Eyes:  Negative for pain and discharge.  ?Respiratory:  Negative for cough and  shortness of breath.   ?Cardiovascular:  Negative for chest pain and palpitations.  ?Gastrointestinal:  Negative for abdominal pain, constipation, diarrhea, nausea and vomiting.  ?Endocrine: Negative for polydipsia and polyuria.  ?Genitourinary:  Negative for dysuria and hematuria.  ?Musculoskeletal:  Negative for neck pain and neck stiffness.  ?Skin:  Negative for rash.  ?Neurological:  Negative for dizziness and weakness.  ?Psychiatric/Behavioral:  Negative for agitation and behavioral problems.   ? ?  ?Objective:  ?  ?Physical Exam ?Vitals reviewed.  ?Constitutional:   ?   General: She is not in acute distress. ?   Appearance: She is obese. She is not diaphoretic.  ?HENT:  ?   Head: Normocephalic and atraumatic.  ?   Nose: Nose normal.  ?   Mouth/Throat:  ?   Mouth: Mucous membranes are moist.  ?Eyes:  ?   General: No scleral icterus. ?   Extraocular Movements: Extraocular movements intact.  ?Cardiovascular:  ?   Rate and Rhythm: Normal rate and regular rhythm.  ?   Pulses: Normal pulses.  ?   Heart sounds: Normal heart sounds. No murmur heard. ?Pulmonary:  ?   Breath sounds: Normal breath sounds. No wheezing or rales.  ?Musculoskeletal:  ?   Cervical back: Neck supple. No tenderness.  ?   Right lower leg: No edema.  ?   Left lower leg: No edema.  ?Skin: ?   General: Skin is warm.  ?   Findings: No rash.  ?Neurological:  ?   General: No focal deficit present.  ?   Mental Status: She is alert and oriented to person, place, and time.  ?   Sensory: No sensory deficit.  ?   Motor: No weakness.  ?Psychiatric:     ?   Mood and Affect: Mood normal.     ?   Behavior: Behavior normal.  ? ? ?BP 118/72 (BP Location: Left Arm, Patient Position: Sitting, Cuff Size: Normal)   Pulse 80   Resp 18   Ht 5' 5"  (1.651 m)   Wt 283 lb 12.8 oz (128.7 kg)   SpO2 97%   BMI 47.23 kg/m?  ?Wt Readings from Last 3 Encounters:  ?04/10/22 283 lb 12.8 oz (128.7 kg)  ?02/18/22 293 lb (132.9 kg)  ?01/28/22 293 lb 9.6 oz (133.2 kg)  ? ? ?Lab  Results  ?Component Value Date  ? TSH 3.800 12/27/2021  ? ?Lab Results  ?Component Value Date  ? WBC 10.3 12/27/2021  ? HGB 13.9 12/27/2021  ? HCT 42.3 12/27/2021  ? MCV 87 12/27/2021  ? PLT 346 12/27/2021  ? ?Lab Results  ?Component Value Date  ? NA 138 12/27/2021  ? K 4.9 12/27/2021  ? CO2 22  12/27/2021  ? GLUCOSE 148 (H) 12/27/2021  ? BUN 7 12/27/2021  ? CREATININE 0.56 (L) 12/27/2021  ? BILITOT 0.5 12/27/2021  ? ALKPHOS 115 12/27/2021  ? AST 18 12/27/2021  ? ALT 17 12/27/2021  ? PROT 7.3 12/27/2021  ? ALBUMIN 4.5 12/27/2021  ? CALCIUM 9.5 12/27/2021  ? ANIONGAP 11 02/20/2021  ? EGFR 112 12/27/2021  ? ?Lab Results  ?Component Value Date  ? CHOL 128 12/27/2021  ? ?Lab Results  ?Component Value Date  ? HDL 40 12/27/2021  ? ?Lab Results  ?Component Value Date  ? [place] 75 12/27/2021  ? ?Lab Results  ?Component Value Date  ? TRIG 64 12/27/2021  ? ?Lab Results  ?Component Value Date  ? CHOLHDL 3.2 12/27/2021  ? ?Lab Results  ?Component Value Date  ? HGBA1C 6.6 04/10/2022  ? ? ?  ?Assessment & Plan:  ? ?Problem List Items Addressed This Visit   ? ?  ? Cardiovascular and Mediastinum  ? Essential hypertension  ?  BP Readings from Last 1 Encounters:  ?04/10/22 118/72  ?Well-controlled with Lisinopril 2.5 mg QD and Metoprolol 50 mg BID ?Would prefer to increase dose of Lisinopril and DC Metoprolol in future visits ?Counseled for compliance with the medications ?Advised DASH diet and moderate exercise/walking, at least 150 mins/week ?  ?  ?  ? Endocrine  ? Type 2 diabetes mellitus with hyperglycemia, with long-term current use of insulin (HCC) - Primary  ?  Lab Results  ?Component Value Date  ? HGBA1C 6.6 04/10/2022  ? ?On metformin 1000 mg twice daily, glipizide 10 mg twice daily and NovoLog 70/30- 20 units twice daily ?On Mounjaro, increased dose as tolerated ?Was not able to afford long-acting insulin ?Decreased Novolin 70/30 to 10 U BID ?Advised to bring glucose meter and logs in the next visit ?She has h/o  hypoglycemia, prescribed Dexcom ?Advised to follow diabetic diet ?On ACEi, not on statin - LDL 75 currently ?Diabetic eye exam: Advised to follow up with Ophthalmology for diabetic eye exam ?  ?  ? Relevant Medications  ? ti

## 2022-04-11 ENCOUNTER — Telehealth: Payer: Self-pay

## 2022-04-11 ENCOUNTER — Other Ambulatory Visit: Payer: Self-pay | Admitting: *Deleted

## 2022-04-11 DIAGNOSIS — E1165 Type 2 diabetes mellitus with hyperglycemia: Secondary | ICD-10-CM

## 2022-04-11 MED ORDER — DEXCOM G7 SENSOR MISC: 0 refills | Status: DC

## 2022-04-11 NOTE — Telephone Encounter (Signed)
Patient called said her pharmacy [facility] has this in stock Continuous Blood Gluc Sensor ([place]) MISC   ?Can provider send to [place] ?

## 2022-04-11 NOTE — Telephone Encounter (Signed)
Sent to [place]  ?

## 2022-04-12 LAB — MICROALBUMIN / CREATININE URINE RATIO
Creatinine, Urine: 26.5 mg/dL
Microalb/Creat Ratio: <11 mg/g creat (ref 0–29)
Microalbumin, Urine: <3 ug/mL

## 2022-05-12 ENCOUNTER — Other Ambulatory Visit: Payer: Self-pay | Admitting: Internal Medicine

## 2022-05-16 ENCOUNTER — Other Ambulatory Visit: Payer: Self-pay | Admitting: Internal Medicine

## 2022-05-16 DIAGNOSIS — Z794 Long term (current) use of insulin: Secondary | ICD-10-CM

## 2022-06-23 ENCOUNTER — Other Ambulatory Visit: Payer: Self-pay | Admitting: Internal Medicine

## 2022-06-23 DIAGNOSIS — E1165 Type 2 diabetes mellitus with hyperglycemia: Secondary | ICD-10-CM

## 2022-07-01 ENCOUNTER — Other Ambulatory Visit: Payer: Self-pay | Admitting: Internal Medicine

## 2022-07-01 DIAGNOSIS — E1165 Type 2 diabetes mellitus with hyperglycemia: Secondary | ICD-10-CM

## 2022-07-15 ENCOUNTER — Other Ambulatory Visit: Payer: Self-pay | Admitting: Internal Medicine

## 2022-07-15 DIAGNOSIS — E1165 Type 2 diabetes mellitus with hyperglycemia: Secondary | ICD-10-CM

## 2022-07-23 ENCOUNTER — Encounter: Payer: 59 | Admitting: Internal Medicine

## 2022-08-06 LAB — CMP14+EGFR
ALT: 21 IU/L (ref 0–32)
AST: 17 IU/L (ref 0–40)
Albumin/Globulin Ratio: 1.3 (ref 1.2–2.2)
Albumin: 4.3 g/dL (ref 3.9–4.9)
Alkaline Phosphatase: 94 IU/L (ref 44–121)
BUN/Creatinine Ratio: 15 (ref 9–23)
BUN: 9 mg/dL (ref 6–24)
Bilirubin Total: 0.4 mg/dL (ref 0.0–1.2)
CO2: 25 mmol/L (ref 20–29)
Calcium: 9.9 mg/dL (ref 8.7–10.2)
Chloride: 104 mmol/L (ref 96–106)
Creatinine, Ser: 0.59 mg/dL (ref 0.57–1.00)
Globulin, Total: 3.2 g/dL (ref 1.5–4.5)
Glucose: 116 mg/dL — ABNORMAL HIGH (ref 70–99)
Potassium: 4.8 mmol/L (ref 3.5–5.2)
Sodium: 142 mmol/L (ref 134–144)
Total Protein: 7.5 g/dL (ref 6.0–8.5)
eGFR: 110 mL/min/{1.73_m2} (ref >59)

## 2022-08-06 LAB — HEMOGLOBIN A1C
Est. average glucose Bld gHb Est-mCnc: 140 mg/dL
Hgb A1c MFr Bld: 6.5 % — ABNORMAL HIGH (ref 4.8–5.6)

## 2022-08-06 LAB — LIPID PANEL
Chol/HDL Ratio: 3.1 ratio (ref 0.0–4.4)
Cholesterol, Total: 137 mg/dL (ref 100–199)
HDL: 44 mg/dL (ref >39)
LDL Chol Calc (NIH): 78 mg/dL (ref 0–99)
Triglycerides: 75 mg/dL (ref 0–149)
VLDL Cholesterol Cal: 15 mg/dL (ref 5–40)

## 2022-08-08 ENCOUNTER — Other Ambulatory Visit: Payer: Self-pay | Admitting: Internal Medicine

## 2022-08-12 ENCOUNTER — Ambulatory Visit: Payer: 59 | Admitting: Internal Medicine

## 2022-08-12 ENCOUNTER — Encounter: Payer: Self-pay | Admitting: Internal Medicine

## 2022-08-12 VITALS — BP 114/78 | HR 91 | Resp 18 | Ht 66.0 in | Wt 264.8 lb

## 2022-08-12 DIAGNOSIS — I10 Essential (primary) hypertension: Secondary | ICD-10-CM | POA: Diagnosis not present

## 2022-08-12 DIAGNOSIS — Z794 Long term (current) use of insulin: Secondary | ICD-10-CM

## 2022-08-12 DIAGNOSIS — Z23 Encounter for immunization: Secondary | ICD-10-CM | POA: Diagnosis not present

## 2022-08-12 DIAGNOSIS — E1169 Type 2 diabetes mellitus with other specified complication: Secondary | ICD-10-CM | POA: Diagnosis not present

## 2022-08-12 DIAGNOSIS — Z0001 Encounter for general adult medical examination with abnormal findings: Secondary | ICD-10-CM | POA: Insufficient documentation

## 2022-08-12 DIAGNOSIS — E782 Mixed hyperlipidemia: Secondary | ICD-10-CM

## 2022-08-12 MED ORDER — ROSUVASTATIN CALCIUM 5 MG PO TABS: 5.0000 mg | ORAL_TABLET | Freq: Every day | ORAL | 1 refills | Status: DC

## 2022-08-12 MED ORDER — GLIPIZIDE 10 MG PO TABS: 10.0000 mg | ORAL_TABLET | Freq: Two times a day (BID) | ORAL | 1 refills | Status: DC

## 2022-08-12 MED ORDER — FREESTYLE LIBRE 2 READER DEVI: 0 refills | Status: DC

## 2022-08-12 MED ORDER — FREESTYLE LIBRE 2 SENSOR MISC: 5 refills | Status: DC

## 2022-08-12 MED ORDER — METFORMIN HCL 1000 MG PO TABS: 1000.0000 mg | ORAL_TABLET | Freq: Two times a day (BID) | ORAL | 1 refills | Status: DC

## 2022-08-12 MED ORDER — TIRZEPATIDE 10 MG/0.5ML ~~LOC~~ SOAJ: 10.0000 mg | SUBCUTANEOUS | 0 refills | Status: DC

## 2022-08-12 NOTE — Assessment & Plan Note (Signed)

## 2022-08-12 NOTE — Patient Instructions (Signed)
Please start taking Mounjaro 10 mg every week once you complete the 7.5 mg dose. Please stop Novolog once you start Mounjaro 10 mg.  Please start taking Crestor in the evening for cholesterol.  Please continue to follow low carb diet and ambulate as tolerated.

## 2022-08-12 NOTE — Progress Notes (Signed)
Established Patient Office Visit  Subjective:  Patient ID: Tara Santiago, female    DOB: 12-May-1971  Age: 51 y.o. MRN: [id number]  CC:  Chief Complaint  Patient presents with   Annual Exam    Annual exam     HPI Tara Santiago is a 51 y.o. female with past medical history of HTN, type II DM and urge incontinence who presents for annual physical.  HTN: BP is well-controlled. Takes medications regularly. Patient denies headache, dizziness, chest pain, dyspnea or palpitations.   Type II DM: She has started taking Mounjaro 7.5 mg qw and has been tolerating it well.  She continues to take metformin, glipizide and Novolin 70/30 -10 units twice daily. She admits that she takes Novolin 70/30 only once on many days as her blood glucose stays around 90s on many days. She denies any episode of hypoglycemia recently, but has had blood glucose in 60s in the past. She has lost about 19 lbs since the last visit.  She denies any polyuria or polydipsia currently.  She takes oxybutynin for urge incontinence.  She takes Micronor for irregular menstrual bleeding.  She denies any dysuria, hematuria or vaginal discharge currently.  Past Medical History:  Diagnosis Date   Diabetes mellitus without complication ([place])    Hypertension    Thyroid disease     Past Surgical History:  Procedure Laterality Date   NO PAST SURGERIES     ORIF ANKLE FRACTURE Right 02/21/2021   Procedure: OPEN REDUCTION INTERNAL FIXATION (ORIF) ANKLE FRACTURE;  Surgeon: Mordecai Rasmussen, MD;  Location: [facility];  Service: Orthopedics;  Laterality: Right;    History reviewed. No pertinent family history.  Social History   Socioeconomic History   Marital status: Married    Spouse name: Not on file   Number of children: Not on file   Years of education: Not on file   Highest education level: Not on file  Occupational History   Not on file  Tobacco Use   Smoking status: Never   Smokeless tobacco: Never  Substance and  Sexual Activity   Alcohol use: Never   Drug use: Never   Sexual activity: Not on file  Other Topics Concern   Not on file  Social History Narrative   Not on file   Social Determinants of Health   Financial Resource Strain: Not on file  Food Insecurity: Not on file  Transportation Needs: Not on file  Physical Activity: Not on file  Stress: Not on file  Social Connections: Not on file  Intimate Partner Violence: Not on file    Outpatient Medications Prior to Visit  Medication Sig Dispense Refill   APPLE CIDER VINEGAR PO Take 450 mg by mouth daily.     Cholecalciferol (VITAMIN D) 125 MCG (5000 UT) CAPS Take 5,000 Units by mouth daily.     lisinopril (ZESTRIL) 2.5 MG tablet Take 1 tablet (2.5 mg total) by mouth daily. 90 tablet 1   metoprolol tartrate (LOPRESSOR) 50 MG tablet Take 1 tablet by mouth twice daily 90 tablet 0   norethindrone (MICRONOR) 0.35 MG tablet Take 1 tablet (0.35 mg total) by mouth daily. 28 tablet 11   NOVOLIN 70/30 RELION (70-30) 100 UNIT/ML injection Inject 10 Units into the skin 2 (two) times daily with a meal. 10 mL 1   oxybutynin (DITROPAN-XL) 10 MG 24 hr tablet Take 1 tablet (10 mg total) by mouth daily. 90 tablet 1   Continuous Blood Gluc Sensor (DEXCOM G7 SENSOR)  MISC Please use it to check blood glucose 3 times before meals, at bedtime and as needed. 3 each 0   glipiZIDE (GLUCOTROL) 10 MG tablet Take 1 tablet by mouth twice daily 90 tablet 0   metFORMIN (GLUCOPHAGE) 1000 MG tablet Take 1 tablet by mouth twice daily 90 tablet 0   MOUNJARO 7.5 MG/0.5ML Pen INJECT 7.5 MG INTO THE SKIN ONCE A WEEK 12 mL 0   No facility-administered medications prior to visit.    Allergies  Allergen Reactions   Peanut-Containing Drug Products Hives    Patient reports dizzy and passes out.    ROS Review of Systems  Constitutional:  Negative for chills and fever.  HENT:  Negative for congestion, sinus pressure, sinus pain and sore throat.   Eyes:  Negative for pain  and discharge.  Respiratory:  Negative for cough and shortness of breath.   Cardiovascular:  Negative for chest pain and palpitations.  Gastrointestinal:  Negative for abdominal pain, constipation, diarrhea, nausea and vomiting.  Endocrine: Negative for polydipsia and polyuria.  Genitourinary:  Negative for dysuria and hematuria.  Musculoskeletal:  Negative for neck pain and neck stiffness.  Skin:  Negative for rash.  Neurological:  Negative for dizziness and weakness.  Psychiatric/Behavioral:  Negative for agitation and behavioral problems.       Objective:    Physical Exam Vitals reviewed.  Constitutional:      General: She is not in acute distress.    Appearance: She is obese. She is not diaphoretic.  HENT:     Head: Normocephalic and atraumatic.     Nose: Nose normal.     Mouth/Throat:     Mouth: Mucous membranes are moist.  Eyes:     General: No scleral icterus.    Extraocular Movements: Extraocular movements intact.  Cardiovascular:     Rate and Rhythm: Normal rate and regular rhythm.     Pulses: Normal pulses.     Heart sounds: Normal heart sounds. No murmur heard. Pulmonary:     Breath sounds: Normal breath sounds. No wheezing or rales.  Abdominal:     Palpations: Abdomen is soft.     Tenderness: There is no abdominal tenderness.  Musculoskeletal:     Cervical back: Neck supple. No tenderness.     Right lower leg: No edema.     Left lower leg: No edema.  Skin:    General: Skin is warm.     Findings: No rash.  Neurological:     General: No focal deficit present.     Mental Status: She is alert and oriented to person, place, and time.     Cranial Nerves: No cranial nerve deficit.     Sensory: No sensory deficit.     Motor: No weakness.  Psychiatric:        Mood and Affect: Mood normal.        Behavior: Behavior normal.     BP 114/78 (BP Location: Left Arm, Patient Position: Sitting, Cuff Size: Normal)   Pulse 91   Resp 18   Ht 5\' 6"  (1.676 m)   Wt 264  lb 12.8 oz (120.1 kg)   SpO2 99%   BMI 42.74 kg/m  Wt Readings from Last 3 Encounters:  08/12/22 264 lb 12.8 oz (120.1 kg)  04/10/22 283 lb 12.8 oz (128.7 kg)  02/18/22 293 lb (132.9 kg)    Lab Results  Component Value Date   TSH 3.800 12/27/2021   Lab Results  Component Value Date  WBC 10.3 12/27/2021   HGB 13.9 12/27/2021   HCT 42.3 12/27/2021   MCV 87 12/27/2021   PLT 346 12/27/2021   Lab Results  Component Value Date   NA 142 08/05/2022   K 4.8 08/05/2022   CO2 25 08/05/2022   GLUCOSE 116 (H) 08/05/2022   BUN 9 08/05/2022   CREATININE 0.59 08/05/2022   BILITOT 0.4 08/05/2022   ALKPHOS 94 08/05/2022   AST 17 08/05/2022   ALT 21 08/05/2022   PROT 7.5 08/05/2022   ALBUMIN 4.3 08/05/2022   CALCIUM 9.9 08/05/2022   ANIONGAP 11 02/20/2021   EGFR 110 08/05/2022   Lab Results  Component Value Date   CHOL 137 08/05/2022   Lab Results  Component Value Date   HDL 44 08/05/2022   Lab Results  Component Value Date   LDLCALC 78 08/05/2022   Lab Results  Component Value Date   TRIG 75 08/05/2022   Lab Results  Component Value Date   CHOLHDL 3.1 08/05/2022   Lab Results  Component Value Date   HGBA1C 6.5 (H) 08/05/2022      Assessment & Plan:   Problem List Items Addressed This Visit       Cardiovascular and Mediastinum   Essential hypertension    BP Readings from Last 1 Encounters:  08/12/22 114/78  Well-controlled with Lisinopril 2.5 mg QD and Metoprolol 50 mg BID Would prefer to increase dose of Lisinopril and DC Metoprolol in future visits Counseled for compliance with the medications Advised DASH diet and moderate exercise/walking, at least 150 mins/week      Relevant Medications   rosuvastatin (CRESTOR) 5 MG tablet     Endocrine   Type 2 diabetes mellitus with other specified complication (HCC)    Lab Results  Component Value Date   HGBA1C 6.5 (H) 08/05/2022   On metformin 1000 mg twice daily, glipizide 10 mg twice daily and NovoLog  70/30- 10 units daily On Mounjaro, increased dose as tolerated Was not able to afford long-acting insulin Discontinue Novolin 70/30 Advised to bring glucose meter and logs in the next visit She has h/o hypoglycemia, prescribed Dexcom, but does not have smartphone to connect - changed to freestyle libre 2 with reader Advised to follow diabetic diet On ACEi, added statin Diabetic eye exam: Advised to follow up with Ophthalmology for diabetic eye exam      Relevant Medications   Continuous Blood Gluc Sensor (FREESTYLE LIBRE 2 SENSOR) MISC   Continuous Blood Gluc Receiver (FREESTYLE LIBRE 2 READER) DEVI   glipiZIDE (GLUCOTROL) 10 MG tablet   metFORMIN (GLUCOPHAGE) 1000 MG tablet   tirzepatide (MOUNJARO) 10 MG/0.5ML Pen   rosuvastatin (CRESTOR) 5 MG tablet     Other   Mixed hyperlipidemia    Added Crestor Reviewed lipid profile      Relevant Medications   rosuvastatin (CRESTOR) 5 MG tablet   Encounter for general adult medical examination with abnormal findings - Primary    Physical exam as documented. Counseling done  re healthy lifestyle involving commitment to 150 minutes exercise per week, heart healthy diet, and attaining healthy weight.The importance of adequate sleep also discussed. Changes in health habits are decided on by the patient with goals and time frames  set for achieving them. Immunization and cancer screening needs are specifically addressed at this visit.      Other Visit Diagnoses     Need for immunization against influenza       Relevant Orders   Flu Vaccine QUAD 70mo+IM (  Fluarix, Fluzone & Alfiuria Quad PF) (Completed)       Meds ordered this encounter  Medications   Continuous Blood Gluc Sensor (FREESTYLE LIBRE 2 SENSOR) MISC    Sig: Please use it to check blood glucose as instructed.    Dispense:  2 each    Refill:  5   Continuous Blood Gluc Receiver (FREESTYLE LIBRE 2 READER) DEVI    Sig: Please use it to check blood glucose as instructed.     Dispense:  1 each    Refill:  0   glipiZIDE (GLUCOTROL) 10 MG tablet    Sig: Take 1 tablet (10 mg total) by mouth 2 (two) times daily.    Dispense:  180 tablet    Refill:  1   metFORMIN (GLUCOPHAGE) 1000 MG tablet    Sig: Take 1 tablet (1,000 mg total) by mouth 2 (two) times daily.    Dispense:  180 tablet    Refill:  1   tirzepatide (MOUNJARO) 10 MG/0.5ML Pen    Sig: Inject 10 mg into the skin once a week.    Dispense:  6 mL    Refill:  0   rosuvastatin (CRESTOR) 5 MG tablet    Sig: Take 1 tablet (5 mg total) by mouth daily.    Dispense:  90 tablet    Refill:  1    Follow-up: Return in about 4 months (around 12/12/2022) for DM and HLD.    Lindell Spar, MD

## 2022-08-12 NOTE — Assessment & Plan Note (Signed)
BP Readings from Last 1 Encounters:  08/12/22 114/78   Well-controlled with Lisinopril 2.5 mg QD and Metoprolol 50 mg BID Would prefer to increase dose of Lisinopril and DC Metoprolol in future visits Counseled for compliance with the medications Advised DASH diet and moderate exercise/walking, at least 150 mins/week

## 2022-08-12 NOTE — Assessment & Plan Note (Signed)
Added Crestor Reviewed lipid profile

## 2022-08-12 NOTE — Assessment & Plan Note (Addendum)
Lab Results  Component Value Date   HGBA1C 6.5 (H) 08/05/2022    On metformin 1000 mg twice daily, glipizide 10 mg twice daily and NovoLog 70/30- 10 units daily On Mounjaro, increased dose as tolerated Was not able to afford long-acting insulin Discontinue Novolin 70/30 Advised to bring glucose meter and logs in the next visit She has h/o hypoglycemia, prescribed Dexcom, but does not have smartphone to connect - changed to freestyle libre 2 with reader Advised to follow diabetic diet On ACEi, added statin Diabetic eye exam: Advised to follow up with Ophthalmology for diabetic eye exam

## 2022-08-18 ENCOUNTER — Other Ambulatory Visit (HOSPITAL_COMMUNITY)
Admission: RE | Admit: 2022-08-18 | Discharge: 2022-08-18 | Disposition: A | Discharge status: Home / Self Care | Payer: 59 | Source: Ambulatory Visit | Attending: Family Medicine | Admitting: Family Medicine

## 2022-08-18 ENCOUNTER — Ambulatory Visit (INDEPENDENT_AMBULATORY_CARE_PROVIDER_SITE_OTHER): Payer: 59 | Admitting: Family Medicine

## 2022-08-18 ENCOUNTER — Encounter: Payer: Self-pay | Admitting: Family Medicine

## 2022-08-18 VITALS — BP 125/83 | HR 91 | Ht 65.0 in | Wt 266.1 lb

## 2022-08-18 DIAGNOSIS — Z124 Encounter for screening for malignant neoplasm of cervix: Secondary | ICD-10-CM | POA: Diagnosis not present

## 2022-08-18 DIAGNOSIS — B372 Candidiasis of skin and nail: Secondary | ICD-10-CM | POA: Diagnosis not present

## 2022-08-18 MED ORDER — CLOTRIMAZOLE 1 % EX CREA: 1.0000 | TOPICAL_CREAM | Freq: Two times a day (BID) | CUTANEOUS | 0 refills | Status: DC

## 2022-08-18 NOTE — Assessment & Plan Note (Signed)
-   According to the [place], cytology and HPV co-testing (preferred) every 5 years or cytology alone (acceptable) every 3 years. - Pap due 2026/2028

## 2022-08-18 NOTE — Patient Instructions (Signed)
I appreciate the opportunity to provide care to you today!    Follow up:  with Dr. Posey Pronto  We will contact you with your pap results    Please continue to a heart-healthy diet and increase your physical activities. Try to exercise for 1mns at least three times a week.      It was a pleasure to see you and I look forward to continuing to work together on your health and well-being. Please do not hesitate to call the office if you need care or have questions about your care.   Have a wonderful day and week. With Gratitude, GAlvira MondayMSN, FNP-BC

## 2022-08-18 NOTE — Assessment & Plan Note (Signed)
Erythematous rash was noted in the groin and inner thigh during pap exam Reports worsening of symptoms in the winter months The rash is likely of candida infection Will treat with topical antifungal cream

## 2022-08-18 NOTE — Progress Notes (Signed)
Established Patient Office Visit  Subjective:  Patient ID: Tara Santiago, female    DOB: May 19, 1971  Age: 51 y.o. MRN: [id number]  CC:  Chief Complaint  Patient presents with   Gynecologic Exam    Pt here for a Pap.     HPI Tara Santiago is a 51 y.o. female with past medical history of essential hypertension and T2DM presents for a pap smear   Past Medical History:  Diagnosis Date   Diabetes mellitus without complication ([place])    Hypertension    Thyroid disease     Past Surgical History:  Procedure Laterality Date   NO PAST SURGERIES     ORIF ANKLE FRACTURE Right 02/21/2021   Procedure: OPEN REDUCTION INTERNAL FIXATION (ORIF) ANKLE FRACTURE;  Surgeon: Mordecai Rasmussen, MD;  Location: [facility];  Service: Orthopedics;  Laterality: Right;    History reviewed. No pertinent family history.  Social History   Socioeconomic History   Marital status: Married    Spouse name: Not on file   Number of children: Not on file   Years of education: Not on file   Highest education level: Not on file  Occupational History   Not on file  Tobacco Use   Smoking status: Never   Smokeless tobacco: Never  Substance and Sexual Activity   Alcohol use: Never   Drug use: Never   Sexual activity: Not on file  Other Topics Concern   Not on file  Social History Narrative   Not on file   Social Determinants of Health   Financial Resource Strain: Not on file  Food Insecurity: Not on file  Transportation Needs: Not on file  Physical Activity: Not on file  Stress: Not on file  Social Connections: Not on file  Intimate Partner Violence: Not on file    Outpatient Medications Prior to Visit  Medication Sig Dispense Refill   APPLE CIDER VINEGAR PO Take 450 mg by mouth daily.     Cholecalciferol (VITAMIN D) 125 MCG (5000 UT) CAPS Take 5,000 Units by mouth daily.     Continuous Blood Gluc Receiver (FREESTYLE LIBRE 2 READER) DEVI Please use it to check blood glucose as instructed. 1 each  0   Continuous Blood Gluc Sensor (FREESTYLE LIBRE 2 SENSOR) MISC Please use it to check blood glucose as instructed. 2 each 5   glipiZIDE (GLUCOTROL) 10 MG tablet Take 1 tablet (10 mg total) by mouth 2 (two) times daily. 180 tablet 1   lisinopril (ZESTRIL) 2.5 MG tablet Take 1 tablet (2.5 mg total) by mouth daily. 90 tablet 1   metFORMIN (GLUCOPHAGE) 1000 MG tablet Take 1 tablet (1,000 mg total) by mouth 2 (two) times daily. 180 tablet 1   metoprolol tartrate (LOPRESSOR) 50 MG tablet Take 1 tablet by mouth twice daily 90 tablet 0   norethindrone (MICRONOR) 0.35 MG tablet Take 1 tablet (0.35 mg total) by mouth daily. 28 tablet 11   NOVOLIN 70/30 RELION (70-30) 100 UNIT/ML injection Inject 10 Units into the skin 2 (two) times daily with a meal. 10 mL 1   oxybutynin (DITROPAN-XL) 10 MG 24 hr tablet Take 1 tablet (10 mg total) by mouth daily. 90 tablet 1   rosuvastatin (CRESTOR) 5 MG tablet Take 1 tablet (5 mg total) by mouth daily. 90 tablet 1   tirzepatide (MOUNJARO) 10 MG/0.5ML Pen Inject 10 mg into the skin once a week. 6 mL 0   No facility-administered medications prior to visit.    Allergies  Allergen Reactions   Peanut-Containing Drug Products Hives    Patient reports dizzy and passes out.    ROS Review of Systems  Constitutional:  Negative for fatigue and fever.  Endocrine: Negative for polydipsia, polyphagia and polyuria.  Genitourinary:  Negative for decreased urine volume, vaginal bleeding, vaginal discharge and vaginal pain.  Skin:  Positive for rash.  Neurological:  Negative for dizziness and headaches.      Objective:    Physical Exam Cardiovascular:     Rate and Rhythm: Normal rate and regular rhythm.     Pulses: Normal pulses.     Heart sounds: Normal heart sounds.  Pulmonary:     Effort: Pulmonary effort is normal.     Breath sounds: Normal breath sounds.  Genitourinary:    Exam position: Lithotomy position.     Pubic Area: No rash.      Tanner stage  (genital): 5.     Comments: Vaginal wall: pink and rugated, smooth and non-tender; absence of lesions, edema, and erythema. Labia Majora and Minora: present bilaterally, moist, soft tissue, and homogeneous; free of edema and ulcerations. Clitoris is anatomically present, above the urethral, and free of lesions, masses, and ulceration.    Skin:    Findings: Rash (in the groin and inner thighs) present.  Neurological:     Mental Status: She is alert.     BP 125/83   Pulse 91   Ht _0  (1.651 m)   Wt 266 lb 1.3 oz (120.7 kg)   SpO2 95%   BMI 44.28 kg/m  Wt Readings from Last 3 Encounters:  08/18/22 266 lb 1.3 oz (120.7 kg)  08/12/22 264 lb 12.8 oz (120.1 kg)  04/10/22 283 lb 12.8 oz (128.7 kg)    Lab Results  Component Value Date   TSH 3.800 12/27/2021   Lab Results  Component Value Date   WBC 10.3 12/27/2021   HGB 13.9 12/27/2021   HCT 42.3 12/27/2021   MCV 87 12/27/2021   PLT 346 12/27/2021   Lab Results  Component Value Date   NA 142 08/05/2022   K 4.8 08/05/2022   CO2 25 08/05/2022   GLUCOSE 116 (H) 08/05/2022   BUN 9 08/05/2022   CREATININE 0.59 08/05/2022   BILITOT 0.4 08/05/2022   ALKPHOS 94 08/05/2022   AST 17 08/05/2022   ALT 21 08/05/2022   PROT 7.5 08/05/2022   ALBUMIN 4.3 08/05/2022   CALCIUM 9.9 08/05/2022   ANIONGAP 11 02/20/2021   EGFR 110 08/05/2022   Lab Results  Component Value Date   CHOL 137 08/05/2022   Lab Results  Component Value Date   HDL 44 08/05/2022   Lab Results  Component Value Date   LDLCALC 78 08/05/2022   Lab Results  Component Value Date   TRIG 75 08/05/2022   Lab Results  Component Value Date   CHOLHDL 3.1 08/05/2022   Lab Results  Component Value Date   HGBA1C 6.5 (H) 08/05/2022      Assessment & Plan:   Problem List Items Addressed This Visit       Musculoskeletal and Integument   Cutaneous candidiasis    Erythematous rash was noted in the groin and inner thigh during pap exam Reports worsening  of symptoms in the winter months The rash is likely of candida infection Will treat with topical antifungal cream      Relevant Medications   clotrimazole (ANTIFUNGAL CLOTRIMAZOLE) 1 % cream     Other   Cervical cancer screening    -  According to the [facility], cytology and HPV co-testing (preferred) every 5 years or cytology alone (acceptable) every 3 years. - Pap due 2026/2028      Other Visit Diagnoses     Pap smear for cervical cancer screening    -  Primary   Relevant Orders   Cytology - PAP       Meds ordered this encounter  Medications   clotrimazole (ANTIFUNGAL CLOTRIMAZOLE) 1 % cream    Sig: Apply 1 Application topically 2 (two) times daily.    Dispense:  30 g    Refill:  0    Follow-up: No follow-ups on file.    Alvira Monday, FNP

## 2022-08-20 LAB — CYTOLOGY - PAP
Comment: NEGATIVE
Diagnosis: NEGATIVE
High risk HPV: NEGATIVE

## 2022-08-20 NOTE — Progress Notes (Signed)
Please inform the patient that her pap smear was negative for abnormality.

## 2022-08-28 ENCOUNTER — Other Ambulatory Visit: Payer: Self-pay | Admitting: Internal Medicine

## 2022-08-28 DIAGNOSIS — I10 Essential (primary) hypertension: Secondary | ICD-10-CM

## 2022-09-29 ENCOUNTER — Other Ambulatory Visit: Payer: Self-pay | Admitting: Internal Medicine

## 2022-09-29 ENCOUNTER — Telehealth: Payer: Self-pay | Admitting: Internal Medicine

## 2022-09-29 NOTE — Telephone Encounter (Signed)
Pt called stating the medication she on for weight loss is making her feel like she is going to choke. She has one dose left for this week. States it is making her sick and does not want to eat. Can you please call patient to let her go over her concerns?

## 2022-10-01 ENCOUNTER — Ambulatory Visit (INDEPENDENT_AMBULATORY_CARE_PROVIDER_SITE_OTHER): Payer: 59 | Admitting: Internal Medicine

## 2022-10-01 ENCOUNTER — Encounter: Payer: Self-pay | Admitting: Internal Medicine

## 2022-10-01 DIAGNOSIS — R110 Nausea: Secondary | ICD-10-CM

## 2022-10-01 DIAGNOSIS — E1169 Type 2 diabetes mellitus with other specified complication: Secondary | ICD-10-CM | POA: Diagnosis not present

## 2022-10-01 DIAGNOSIS — Z794 Long term (current) use of insulin: Secondary | ICD-10-CM

## 2022-10-01 MED ORDER — METOCLOPRAMIDE HCL 5 MG PO TABS: 5.0000 mg | ORAL_TABLET | Freq: Three times a day (TID) | ORAL | 0 refills | Status: DC | PRN

## 2022-10-01 MED ORDER — TIRZEPATIDE 7.5 MG/0.5ML ~~LOC~~ SOAJ: 7.5000 mg | SUBCUTANEOUS | 0 refills | Status: DC

## 2022-10-01 NOTE — Assessment & Plan Note (Signed)
Lab Results  Component Value Date   HGBA1C 6.5 (H) 08/05/2022    On metformin 1000 mg twice daily, glipizide 10 mg twice daily On Mounjaro 7.5 mg qw -  Has nausea and her blood glucose has been very tightly controlled, would prefer to keep same dose for now Discontinue Novolin 70/30 Advised to bring glucose meter and logs in the next visit She has h/o hypoglycemia, prescribed Dexcom, but does not have smartphone to connect - changed to freestyle libre 2 with reader Advised to follow diabetic diet On ACEi, added statin Diabetic eye exam: Advised to follow up with Ophthalmology for diabetic eye exam

## 2022-10-01 NOTE — Patient Instructions (Signed)
Please continue Mounjaro 7.5 mg dose for now.  Please follow small, frequent meals to avoid nausea.  Please take Reglan as needed for nausea.

## 2022-10-01 NOTE — Progress Notes (Signed)
Virtual Visit via Telephone Note   This visit type was conducted via telephone. This format is felt to be most appropriate for this patient at this time.  The patient did not have access to video technology/had technical difficulties with video requiring transitioning to audio format only (telephone).  All issues noted in this document were discussed and addressed.  No physical exam could be performed with this format.  Evaluation Performed:  Follow-up visit  Date:  10/01/2022   ID:  Tara Santiago, DOB 1971/03/20, MRN [id number]  Patient Location: Home Provider Location: Office/Clinic  Participants: Patient Location of Patient: Home Location of Provider: Telehealth Consent was obtain for visit to be over via telehealth. I verified that I am speaking with the correct person using two identifiers.  PCP:  Lindell Spar, MD   Chief Complaint: Nausea  History of Present Illness:    Tara Santiago is a 51 y.o. female who has a televisit for complaint of nausea after taking Mounjaro for about a day.  She has been taking Mounjaro 7.5 mg once weekly.  In the last visit, her dose was increased to 10 mg in an effort to discontinue the insulin.  She reports that she has not been needing insulin lately even with her old dose of Mounjaro.  She has also been following low-carb diet.  Her blood glucose has been to lower 60s at times.  The patient does not have symptoms concerning for COVID-19 infection (fever, chills, cough, or new shortness of breath).   Past Medical, Surgical, Social History, Allergies, and Medications have been Reviewed.  Past Medical History:  Diagnosis Date   Diabetes mellitus without complication ([place])    Hypertension    Thyroid disease    Past Surgical History:  Procedure Laterality Date   NO PAST SURGERIES     ORIF ANKLE FRACTURE Right 02/21/2021   Procedure: OPEN REDUCTION INTERNAL FIXATION (ORIF) ANKLE FRACTURE;  Surgeon: Mordecai Rasmussen, MD;  Location: [facility];  Service: Orthopedics;  Laterality: Right;     Current Meds  Medication Sig   APPLE CIDER VINEGAR PO Take 450 mg by mouth daily.   Cholecalciferol (VITAMIN D) 125 MCG (5000 UT) CAPS Take 5,000 Units by mouth daily.   clotrimazole (ANTIFUNGAL CLOTRIMAZOLE) 1 % cream Apply 1 Application topically 2 (two) times daily.   Continuous Blood Gluc Receiver (FREESTYLE LIBRE 2 READER) DEVI Please use it to check blood glucose as instructed.   Continuous Blood Gluc Sensor (FREESTYLE LIBRE 2 SENSOR) MISC Please use it to check blood glucose as instructed.   glipiZIDE (GLUCOTROL) 10 MG tablet Take 1 tablet (10 mg total) by mouth 2 (two) times daily.   lisinopril (ZESTRIL) 2.5 MG tablet Take 1 tablet by mouth once daily   metFORMIN (GLUCOPHAGE) 1000 MG tablet Take 1 tablet (1,000 mg total) by mouth 2 (two) times daily.   metoprolol tartrate (LOPRESSOR) 50 MG tablet Take 1 tablet by mouth twice daily   norethindrone (MICRONOR) 0.35 MG tablet Take 1 tablet (0.35 mg total) by mouth daily.   NOVOLIN 70/30 RELION (70-30) 100 UNIT/ML injection Inject 10 Units into the skin 2 (two) times daily with a meal.   oxybutynin (DITROPAN-XL) 10 MG 24 hr tablet Take 1 tablet (10 mg total) by mouth daily.   rosuvastatin (CRESTOR) 5 MG tablet Take 1 tablet (5 mg total) by mouth daily.   tirzepatide (MOUNJARO) 10 MG/0.5ML Pen Inject 10 mg into the skin once a week.  Allergies:   Peanut-containing drug products   ROS:   Please see the history of present illness.     All other systems reviewed and are negative.   Labs/Other Tests and Data Reviewed:    Recent Labs: 12/27/2021: Hemoglobin 13.9; Platelets 346; TSH 3.800 08/05/2022: ALT 21; BUN 9; Creatinine, Ser 0.59; Potassium 4.8; Sodium 142   Recent Lipid Panel Lab Results  Component Value Date/Time   CHOL 137 08/05/2022 09:24 AM   TRIG 75 08/05/2022 09:24 AM   HDL 44 08/05/2022 09:24 AM   CHOLHDL 3.1 08/05/2022 09:24 AM   LDLCALC 78 08/05/2022 09:24 AM     Wt Readings from Last 3 Encounters:  08/18/22 266 lb 1.3 oz (120.7 kg)  08/12/22 264 lb 12.8 oz (120.1 kg)  04/10/22 283 lb 12.8 oz (128.7 kg)     ASSESSMENT & PLAN:    Type 2 diabetes mellitus with other specified complication ([facility]) Lab Results  Component Value Date   HGBA1C 6.5 (H) 08/05/2022    On metformin 1000 mg twice daily, glipizide 10 mg twice daily On Mounjaro 7.5 mg qw -  Has nausea and her blood glucose has been very tightly controlled, would prefer to keep same dose for now Discontinue Novolin 70/30 Advised to bring glucose meter and logs in the next visit She has h/o hypoglycemia, prescribed Dexcom, but does not have smartphone to connect - changed to freestyle libre 2 with reader Advised to follow diabetic diet On ACEi, added statin Diabetic eye exam: Advised to follow up with Ophthalmology for diabetic eye exam    Nausea From [facility] Advised to follow small, frequent meals Reglan as needed for nausea - can help with diabetic gastropathy    Time:   Today, I have spent 11 minutes reviewing the chart, including problem list, medications, and with the patient with telehealth technology discussing the above problems.   Medication Adjustments/Labs and Tests Ordered: Current medicines are reviewed at length with the patient today.  Concerns regarding medicines are outlined above.   Tests Ordered: No orders of the defined types were placed in this encounter.   Medication Changes: No orders of the defined types were placed in this encounter.    Note: This dictation was prepared with Dragon dictation along with smaller phrase technology. Similar sounding words can be transcribed inadequately or may not be corrected upon review. Any transcriptional errors that result from this process are unintentional.      Disposition:  Follow up  Signed, Lindell Spar, MD  10/01/2022 9:15 AM     [facility]

## 2022-11-14 ENCOUNTER — Other Ambulatory Visit: Payer: Self-pay | Admitting: Internal Medicine

## 2022-11-26 ENCOUNTER — Other Ambulatory Visit: Payer: Self-pay | Admitting: Internal Medicine

## 2022-11-26 DIAGNOSIS — I10 Essential (primary) hypertension: Secondary | ICD-10-CM

## 2022-12-10 ENCOUNTER — Ambulatory Visit: Payer: 59 | Admitting: Internal Medicine

## 2022-12-10 ENCOUNTER — Encounter: Payer: Self-pay | Admitting: Internal Medicine

## 2022-12-10 ENCOUNTER — Other Ambulatory Visit (HOSPITAL_COMMUNITY): Payer: Self-pay | Admitting: Internal Medicine

## 2022-12-10 VITALS — BP 117/80 | HR 85 | Ht 65.0 in | Wt 258.6 lb

## 2022-12-10 DIAGNOSIS — I10 Essential (primary) hypertension: Secondary | ICD-10-CM | POA: Diagnosis not present

## 2022-12-10 DIAGNOSIS — Z794 Long term (current) use of insulin: Secondary | ICD-10-CM

## 2022-12-10 DIAGNOSIS — C439 Malignant melanoma of skin, unspecified: Secondary | ICD-10-CM

## 2022-12-10 DIAGNOSIS — E1169 Type 2 diabetes mellitus with other specified complication: Secondary | ICD-10-CM | POA: Diagnosis not present

## 2022-12-10 DIAGNOSIS — Z1231 Encounter for screening mammogram for malignant neoplasm of breast: Secondary | ICD-10-CM

## 2022-12-10 DIAGNOSIS — E782 Mixed hyperlipidemia: Secondary | ICD-10-CM

## 2022-12-10 DIAGNOSIS — Z23 Encounter for immunization: Secondary | ICD-10-CM | POA: Diagnosis not present

## 2022-12-10 DIAGNOSIS — R110 Nausea: Secondary | ICD-10-CM

## 2022-12-10 LAB — POCT GLYCOSYLATED HEMOGLOBIN (HGB A1C): HbA1c, POC (controlled diabetic range): 6.2 % (ref 0.0–7.0)

## 2022-12-10 MED ORDER — ONDANSETRON HCL 4 MG PO TABS: 4.0000 mg | ORAL_TABLET | Freq: Three times a day (TID) | ORAL | 1 refills | Status: DC | PRN

## 2022-12-10 MED ORDER — TIRZEPATIDE 5 MG/0.5ML ~~LOC~~ SOAJ: 5.0000 mg | SUBCUTANEOUS | 1 refills | Status: DC

## 2022-12-10 NOTE — Assessment & Plan Note (Signed)
On Crestor Reviewed lipid profile

## 2022-12-10 NOTE — Assessment & Plan Note (Signed)
Due to [facility] Has tried small, frequent meals and Reglan Decreased dose of Mounjaro

## 2022-12-10 NOTE — Assessment & Plan Note (Signed)
Lab Results  Component Value Date   HGBA1C 6.5 (H) 08/05/2022    On metformin 1000 mg twice daily, glipizide 10 mg twice daily On Mounjaro 7.5 mg qw -  Has nausea and her blood glucose has been very tightly controlled, decreased dose to 5 mg qw Discontinue Novolin 70/30 Advised to bring glucose meter and logs in the next visit She has h/o hypoglycemia, had CGM, but had malfunction, prefers to do fingersticks Advised to follow diabetic diet On ACEi and statin Diabetic eye exam: Advised to follow up with Ophthalmology for diabetic eye exam

## 2022-12-10 NOTE — Assessment & Plan Note (Signed)
BP Readings from Last 1 Encounters:  12/10/22 117/80   Well-controlled with Lisinopril 2.5 mg QD and Metoprolol 50 mg BID Would prefer to increase dose of Lisinopril and DC Metoprolol in future visits Counseled for compliance with the medications Advised DASH diet and moderate exercise/walking, at least 150 mins/week

## 2022-12-10 NOTE — Progress Notes (Signed)
Established Patient Office Visit  Subjective:  Patient ID: Tara Santiago, female    DOB: [birth year]  Age: 52 y.o. MRN: [id number]  CC:  Chief Complaint  Patient presents with   Diabetes    Patient is here for a follow up on her diabetes    HPI Tara Santiago is a 52 y.o. female with past medical history of HTN, type II DM and urge incontinence who presents for f/u of her chronic medical conditions.  HTN: BP is well-controlled. Takes medications regularly. Patient denies headache, dizziness, chest pain, dyspnea or palpitations.   Type II DM: She has been taking Mounjaro 7.5 mg qw, but has severe nausea for 2-3 days even if she takes Reglan.  She continues to take metformin, glipizide and her blood glucose stays around 90-130 on many days. She denies any episode of hypoglycemia recently, but has had blood glucose in 60s in the past. She has lost about 6 lbs since the last visit.  She denies any polyuria or polydipsia currently.  She takes oxybutynin for urge incontinence.  She takes Micronor for irregular menstrual bleeding.  She denies any dysuria, hematuria or vaginal discharge currently.  Past Medical History:  Diagnosis Date   Diabetes mellitus without complication ([place])    Hypertension    Thyroid disease     Past Surgical History:  Procedure Laterality Date   NO PAST SURGERIES     ORIF ANKLE FRACTURE Right 02/21/2021   Procedure: OPEN REDUCTION INTERNAL FIXATION (ORIF) ANKLE FRACTURE;  Surgeon: Mordecai Rasmussen, MD;  Location: [facility];  Service: Orthopedics;  Laterality: Right;    History reviewed. No pertinent family history.  Social History   Socioeconomic History   Marital status: Married    Spouse name: Not on file   Number of children: Not on file   Years of education: Not on file   Highest education level: Not on file  Occupational History   Not on file  Tobacco Use   Smoking status: Never   Smokeless tobacco: Never  Substance and Sexual Activity    Alcohol use: Never   Drug use: Never   Sexual activity: Not on file  Other Topics Concern   Not on file  Social History Narrative   Not on file   Social Determinants of Health   Financial Resource Strain: Not on file  Food Insecurity: Not on file  Transportation Needs: Not on file  Physical Activity: Not on file  Stress: Not on file  Social Connections: Not on file  Intimate Partner Violence: Not on file    Outpatient Medications Prior to Visit  Medication Sig Dispense Refill   APPLE CIDER VINEGAR PO Take 450 mg by mouth daily.     Cholecalciferol (VITAMIN D) 125 MCG (5000 UT) CAPS Take 5,000 Units by mouth daily.     clotrimazole (ANTIFUNGAL CLOTRIMAZOLE) 1 % cream Apply 1 Application topically 2 (two) times daily. 30 g 0   glipiZIDE (GLUCOTROL) 10 MG tablet Take 1 tablet (10 mg total) by mouth 2 (two) times daily. 180 tablet 1   lisinopril (ZESTRIL) 2.5 MG tablet Take 1 tablet by mouth once daily 90 tablet 0   metFORMIN (GLUCOPHAGE) 1000 MG tablet Take 1 tablet (1,000 mg total) by mouth 2 (two) times daily. 180 tablet 1   metoprolol tartrate (LOPRESSOR) 50 MG tablet Take 1 tablet by mouth twice daily 90 tablet 0   norethindrone (MICRONOR) 0.35 MG tablet Take 1 tablet (0.35 mg total) by mouth daily.  28 tablet 11   oxybutynin (DITROPAN-XL) 10 MG 24 hr tablet Take 1 tablet (10 mg total) by mouth daily. 90 tablet 1   rosuvastatin (CRESTOR) 5 MG tablet Take 1 tablet (5 mg total) by mouth daily. 90 tablet 1   Continuous Blood Gluc Receiver (FREESTYLE LIBRE 2 READER) DEVI Please use it to check blood glucose as instructed. 1 each 0   Continuous Blood Gluc Sensor (FREESTYLE LIBRE 2 SENSOR) MISC Please use it to check blood glucose as instructed. 2 each 5   metoCLOPramide (REGLAN) 5 MG tablet Take 1 tablet (5 mg total) by mouth every 8 (eight) hours as needed for nausea. 20 tablet 0   NOVOLIN 70/30 RELION (70-30) 100 UNIT/ML injection Inject 10 Units into the skin 2 (two) times daily with  a meal. 10 mL 1   tirzepatide (MOUNJARO) 7.5 MG/0.5ML Pen Inject 7.5 mg into the skin once a week. 6 mL 0   No facility-administered medications prior to visit.    Allergies  Allergen Reactions   Peanut-Containing Drug Products Hives    Patient reports dizzy and passes out.    ROS Review of Systems  Constitutional:  Negative for chills and fever.  HENT:  Negative for congestion, sinus pressure, sinus pain and sore throat.   Eyes:  Negative for pain and discharge.  Respiratory:  Negative for cough and shortness of breath.   Cardiovascular:  Negative for chest pain and palpitations.  Gastrointestinal:  Positive for nausea. Negative for abdominal pain, constipation, diarrhea and vomiting.  Endocrine: Negative for polydipsia and polyuria.  Genitourinary:  Negative for dysuria and hematuria.  Musculoskeletal:  Negative for neck pain and neck stiffness.  Skin:  Negative for rash.  Neurological:  Negative for dizziness and weakness.  Psychiatric/Behavioral:  Negative for agitation and behavioral problems.       Objective:    Physical Exam Vitals reviewed.  Constitutional:      General: She is not in acute distress.    Appearance: She is obese. She is not diaphoretic.  HENT:     Head: Normocephalic and atraumatic.     Nose: Nose normal.     Mouth/Throat:     Mouth: Mucous membranes are moist.  Eyes:     General: No scleral icterus.    Extraocular Movements: Extraocular movements intact.  Cardiovascular:     Rate and Rhythm: Normal rate and regular rhythm.     Pulses: Normal pulses.     Heart sounds: Normal heart sounds. No murmur heard. Pulmonary:     Breath sounds: Normal breath sounds. No wheezing or rales.  Musculoskeletal:     Cervical back: Neck supple. No tenderness.     Right lower leg: No edema.     Left lower leg: No edema.  Skin:    General: Skin is warm.     Findings: No rash.  Neurological:     General: No focal deficit present.     Mental Status: She is  alert and oriented to person, place, and time.     Sensory: No sensory deficit.     Motor: No weakness.  Psychiatric:        Mood and Affect: Mood normal.        Behavior: Behavior normal.     BP 117/80 (BP Location: Left Arm, Patient Position: Sitting, Cuff Size: Large)   Pulse 85   Ht '5\' 5"'$  (1.651 m)   Wt 258 lb 9.6 oz (117.3 kg)   SpO2 98%   BMI  43.03 kg/m  Wt Readings from Last 3 Encounters:  12/10/22 258 lb 9.6 oz (117.3 kg)  08/18/22 266 lb 1.3 oz (120.7 kg)  08/12/22 264 lb 12.8 oz (120.1 kg)    Lab Results  Component Value Date   TSH 3.800 12/27/2021   Lab Results  Component Value Date   WBC 10.3 12/27/2021   HGB 13.9 12/27/2021   HCT 42.3 12/27/2021   MCV 87 12/27/2021   PLT 346 12/27/2021   Lab Results  Component Value Date   NA 142 08/05/2022   K 4.8 08/05/2022   CO2 25 08/05/2022   GLUCOSE 116 (H) 08/05/2022   BUN 9 08/05/2022   CREATININE 0.59 08/05/2022   BILITOT 0.4 08/05/2022   ALKPHOS 94 08/05/2022   AST 17 08/05/2022   ALT 21 08/05/2022   PROT 7.5 08/05/2022   ALBUMIN 4.3 08/05/2022   CALCIUM 9.9 08/05/2022   ANIONGAP 11 02/20/2021   EGFR 110 08/05/2022   Lab Results  Component Value Date   CHOL 137 08/05/2022   Lab Results  Component Value Date   HDL 44 08/05/2022   Lab Results  Component Value Date   LDLCALC 78 08/05/2022   Lab Results  Component Value Date   TRIG 75 08/05/2022   Lab Results  Component Value Date   CHOLHDL 3.1 08/05/2022   Lab Results  Component Value Date   HGBA1C 6.2 12/10/2022      Assessment & Plan:   Problem List Items Addressed This Visit       Cardiovascular and Mediastinum   Essential hypertension    BP Readings from Last 1 Encounters:  12/10/22 117/80  Well-controlled with Lisinopril 2.5 mg QD and Metoprolol 50 mg BID Would prefer to increase dose of Lisinopril and DC Metoprolol in future visits Counseled for compliance with the medications Advised DASH diet and moderate  exercise/walking, at least 150 mins/week        Endocrine   Type 2 diabetes mellitus with other specified complication ([place]) - Primary    Lab Results  Component Value Date   HGBA1C 6.5 (H) 08/05/2022   On metformin 1000 mg twice daily, glipizide 10 mg twice daily On Mounjaro 7.5 mg qw -  Has nausea and her blood glucose has been very tightly controlled, decreased dose to 5 mg qw Discontinue Novolin 70/30 Advised to bring glucose meter and logs in the next visit She has h/o hypoglycemia, had CGM, but had malfunction, prefers to do fingersticks Advised to follow diabetic diet On ACEi and statin Diabetic eye exam: Advised to follow up with Ophthalmology for diabetic eye exam      Relevant Medications   tirzepatide (MOUNJARO) 5 MG/0.5ML Pen   Other Relevant Orders   CMP14+EGFR   Hemoglobin A1c   POCT glycosylated hemoglobin (Hb A1C) (Completed)     Other   Mixed hyperlipidemia    On Crestor Reviewed lipid profile      Relevant Orders   Lipid Profile   Nausea    Due to [facility] Has tried small, frequent meals and Reglan Decreased dose of Mounjaro      Relevant Medications   ondansetron (ZOFRAN) 4 MG tablet   Other Visit Diagnoses     Need for zoster vaccination       Relevant Orders   Zoster Recombinant (Shingrix ) (Completed)       Meds ordered this encounter  Medications   tirzepatide (MOUNJARO) 5 MG/0.5ML Pen    Sig: Inject 5 mg into the skin  once a week.    Dispense:  6 mL    Refill:  1   ondansetron (ZOFRAN) 4 MG tablet    Sig: Take 1 tablet (4 mg total) by mouth every 8 (eight) hours as needed for nausea or vomiting.    Dispense:  20 tablet    Refill:  1    Follow-up: Return in about 4 months (around 04/10/2023) for DM and Shingrix #2.    Lindell Spar, MD

## 2022-12-10 NOTE — Patient Instructions (Addendum)
Please start taking Mounjaro 5 mg instead of 7.5 mg dose.  Please continue taking other medications as prescribed.  Please continue to follow low carb diet and ambulate as tolerated.  Please get fasting blood tests before the next visit.

## 2022-12-15 ENCOUNTER — Ambulatory Visit: Payer: 59 | Admitting: Internal Medicine

## 2022-12-17 ENCOUNTER — Ambulatory Visit (HOSPITAL_COMMUNITY): Payer: 59

## 2022-12-24 ENCOUNTER — Encounter (HOSPITAL_COMMUNITY): Payer: Self-pay | Admitting: Radiology

## 2022-12-24 ENCOUNTER — Ambulatory Visit (HOSPITAL_COMMUNITY)
Admission: RE | Admit: 2022-12-24 | Discharge: 2022-12-24 | Disposition: A | Discharge status: Home / Self Care | Payer: 59 | Source: Ambulatory Visit | Attending: Internal Medicine | Admitting: Internal Medicine

## 2022-12-24 DIAGNOSIS — Z1231 Encounter for screening mammogram for malignant neoplasm of breast: Secondary | ICD-10-CM | POA: Diagnosis not present

## 2022-12-30 ENCOUNTER — Other Ambulatory Visit: Payer: Self-pay | Admitting: Internal Medicine

## 2023-01-14 ENCOUNTER — Other Ambulatory Visit: Payer: Self-pay | Admitting: Internal Medicine

## 2023-01-14 DIAGNOSIS — N926 Irregular menstruation, unspecified: Secondary | ICD-10-CM

## 2023-01-14 DIAGNOSIS — N3941 Urge incontinence: Secondary | ICD-10-CM

## 2023-02-12 ENCOUNTER — Other Ambulatory Visit: Payer: Self-pay | Admitting: Internal Medicine

## 2023-02-12 DIAGNOSIS — N926 Irregular menstruation, unspecified: Secondary | ICD-10-CM

## 2023-02-12 DIAGNOSIS — E1169 Type 2 diabetes mellitus with other specified complication: Secondary | ICD-10-CM

## 2023-02-12 DIAGNOSIS — N3941 Urge incontinence: Secondary | ICD-10-CM

## 2023-02-12 DIAGNOSIS — E782 Mixed hyperlipidemia: Secondary | ICD-10-CM

## 2023-02-16 ENCOUNTER — Other Ambulatory Visit: Payer: Self-pay | Admitting: Internal Medicine

## 2023-02-16 DIAGNOSIS — I10 Essential (primary) hypertension: Secondary | ICD-10-CM

## 2023-03-16 ENCOUNTER — Other Ambulatory Visit: Payer: Self-pay | Admitting: Internal Medicine

## 2023-03-16 DIAGNOSIS — N926 Irregular menstruation, unspecified: Secondary | ICD-10-CM

## 2023-03-16 DIAGNOSIS — N3941 Urge incontinence: Secondary | ICD-10-CM

## 2023-04-04 LAB — LIPID PANEL
Chol/HDL Ratio: 2.2 ratio (ref 0.0–4.4)
Cholesterol, Total: 99 mg/dL — ABNORMAL LOW (ref 100–199)
HDL: 46 mg/dL (ref >39)
LDL Chol Calc (NIH): 39 mg/dL (ref 0–99)
Triglycerides: 60 mg/dL (ref 0–149)
VLDL Cholesterol Cal: 14 mg/dL (ref 5–40)

## 2023-04-04 LAB — CMP14+EGFR
ALT: 16 IU/L (ref 0–32)
AST: 12 IU/L (ref 0–40)
Albumin/Globulin Ratio: 1.6 (ref 1.2–2.2)
Albumin: 4.4 g/dL (ref 3.9–4.9)
Alkaline Phosphatase: 78 IU/L (ref 44–121)
BUN/Creatinine Ratio: 21 (ref 9–23)
BUN: 14 mg/dL (ref 6–24)
Bilirubin Total: 0.6 mg/dL (ref 0.0–1.2)
CO2: 23 mmol/L (ref 20–29)
Calcium: 9.5 mg/dL (ref 8.7–10.2)
Chloride: 104 mmol/L (ref 96–106)
Creatinine, Ser: 0.67 mg/dL (ref 0.57–1.00)
Globulin, Total: 2.8 g/dL (ref 1.5–4.5)
Glucose: 136 mg/dL — ABNORMAL HIGH (ref 70–99)
Potassium: 4.7 mmol/L (ref 3.5–5.2)
Sodium: 141 mmol/L (ref 134–144)
Total Protein: 7.2 g/dL (ref 6.0–8.5)
eGFR: 106 mL/min/{1.73_m2} (ref >59)

## 2023-04-04 LAB — HEMOGLOBIN A1C
Est. average glucose Bld gHb Est-mCnc: 146 mg/dL
Hgb A1c MFr Bld: 6.7 % — ABNORMAL HIGH (ref 4.8–5.6)

## 2023-04-05 ENCOUNTER — Other Ambulatory Visit: Payer: Self-pay | Admitting: Internal Medicine

## 2023-04-13 ENCOUNTER — Encounter: Payer: Self-pay | Admitting: Internal Medicine

## 2023-04-13 ENCOUNTER — Ambulatory Visit: Payer: 59 | Admitting: Internal Medicine

## 2023-04-13 VITALS — BP 117/78 | HR 78 | Ht 65.0 in | Wt 252.4 lb

## 2023-04-13 DIAGNOSIS — E782 Mixed hyperlipidemia: Secondary | ICD-10-CM | POA: Diagnosis not present

## 2023-04-13 DIAGNOSIS — Z794 Long term (current) use of insulin: Secondary | ICD-10-CM

## 2023-04-13 DIAGNOSIS — I10 Essential (primary) hypertension: Secondary | ICD-10-CM | POA: Diagnosis not present

## 2023-04-13 DIAGNOSIS — E1169 Type 2 diabetes mellitus with other specified complication: Secondary | ICD-10-CM | POA: Diagnosis not present

## 2023-04-13 DIAGNOSIS — Z23 Encounter for immunization: Secondary | ICD-10-CM

## 2023-04-13 DIAGNOSIS — N3941 Urge incontinence: Secondary | ICD-10-CM

## 2023-04-13 MED ORDER — LISINOPRIL 5 MG PO TABS: 5.0000 mg | ORAL_TABLET | Freq: Every day | ORAL | 1 refills | Status: DC

## 2023-04-13 MED ORDER — METFORMIN HCL 1000 MG PO TABS: 1000.0000 mg | ORAL_TABLET | Freq: Two times a day (BID) | ORAL | 1 refills | Status: DC

## 2023-04-13 MED ORDER — GLIPIZIDE 10 MG PO TABS: 10.0000 mg | ORAL_TABLET | Freq: Two times a day (BID) | ORAL | 1 refills | Status: DC

## 2023-04-13 NOTE — Progress Notes (Signed)
Established Patient Office Visit  Subjective:  Patient ID: Tara Santiago, female    DOB: [birth year]  Age: 52 y.o. MRN: [id number]  CC:  Chief Complaint  Patient presents with   Diabetes    HPI Tara Santiago is a 51 y.o. female with past medical history of HTN, type II DM and urge incontinence who presents for f/u of her chronic medical conditions.  HTN: BP is well-controlled. Takes medications regularly. Patient denies headache, dizziness, chest pain, dyspnea or palpitations.   Type II DM: She has been taking Mounjaro 5 mg qw. She continues to take metformin, glipizide and her blood glucose stays around 90-130 on many days. She denies any episode of hypoglycemia recently, but has had blood glucose in 60s in the past. She has lost about 6 lbs since the last visit.  She denies any polyuria or polydipsia currently.  She takes oxybutynin for urge incontinence.  She takes Micronor for irregular menstrual bleeding.  She denies any dysuria, hematuria or vaginal discharge currently.  Past Medical History:  Diagnosis Date   Diabetes mellitus without complication (HCC)    Hypertension    Thyroid disease     Past Surgical History:  Procedure Laterality Date   NO PAST SURGERIES     ORIF ANKLE FRACTURE Right 02/21/2021   Procedure: OPEN REDUCTION INTERNAL FIXATION (ORIF) ANKLE FRACTURE;  Surgeon: Oliver Barre, MD;  Location: [facility];  Service: Orthopedics;  Laterality: Right;    History reviewed. No pertinent family history.  Social History   Socioeconomic History   Marital status: Married    Spouse name: Not on file   Number of children: Not on file   Years of education: Not on file   Highest education level: Not on file  Occupational History   Not on file  Tobacco Use   Smoking status: Never   Smokeless tobacco: Never  Substance and Sexual Activity   Alcohol use: Never   Drug use: Never   Sexual activity: Not on file  Other Topics Concern   Not on file  Social  History Narrative   Not on file   Social Determinants of Health   Financial Resource Strain: Not on file  Food Insecurity: Not on file  Transportation Needs: Not on file  Physical Activity: Not on file  Stress: Not on file  Social Connections: Not on file  Intimate Partner Violence: Not on file    Outpatient Medications Prior to Visit  Medication Sig Dispense Refill   APPLE CIDER VINEGAR PO Take 450 mg by mouth daily.     Cholecalciferol (VITAMIN D) 125 MCG (5000 UT) CAPS Take 5,000 Units by mouth daily.     clotrimazole (ANTIFUNGAL CLOTRIMAZOLE) 1 % cream Apply 1 Application topically 2 (two) times daily. 30 g 0   metoprolol tartrate (LOPRESSOR) 50 MG tablet Take 1 tablet by mouth twice daily (Patient taking differently: Take 25 mg by mouth 2 (two) times daily.) 90 tablet 0   norethindrone (MICRONOR) 0.35 MG tablet Take 1 tablet by mouth once daily 28 tablet 0   ondansetron (ZOFRAN) 4 MG tablet Take 1 tablet (4 mg total) by mouth every 8 (eight) hours as needed for nausea or vomiting. 20 tablet 1   oxybutynin (DITROPAN-XL) 10 MG 24 hr tablet Take 1 tablet by mouth once daily 30 tablet 0   rosuvastatin (CRESTOR) 5 MG tablet Take 1 tablet by mouth once daily 90 tablet 0   tirzepatide (MOUNJARO) 5 MG/0.5ML Pen Inject 5 mg  into the skin once a week. 6 mL 1   glipiZIDE (GLUCOTROL) 10 MG tablet Take 1 tablet by mouth twice daily 180 tablet 0   lisinopril (ZESTRIL) 2.5 MG tablet Take 1 tablet by mouth once daily 90 tablet 0   metFORMIN (GLUCOPHAGE) 1000 MG tablet Take 1 tablet by mouth twice daily 180 tablet 0   No facility-administered medications prior to visit.    Allergies  Allergen Reactions   Peanut-Containing Drug Products Hives    Patient reports dizzy and passes out.    ROS Review of Systems  Constitutional:  Negative for chills and fever.  HENT:  Negative for congestion, sinus pressure, sinus pain and sore throat.   Eyes:  Negative for pain and discharge.  Respiratory:   Negative for cough and shortness of breath.   Cardiovascular:  Negative for chest pain and palpitations.  Gastrointestinal:  Positive for nausea. Negative for abdominal pain, constipation, diarrhea and vomiting.  Endocrine: Negative for polydipsia and polyuria.  Genitourinary:  Negative for dysuria and hematuria.  Musculoskeletal:  Negative for neck pain and neck stiffness.  Skin:  Negative for rash.  Neurological:  Negative for dizziness and weakness.  Psychiatric/Behavioral:  Negative for agitation and behavioral problems.       Objective:    Physical Exam Vitals reviewed.  Constitutional:      General: She is not in acute distress.    Appearance: She is obese. She is not diaphoretic.  HENT:     Head: Normocephalic and atraumatic.     Nose: Nose normal.     Mouth/Throat:     Mouth: Mucous membranes are moist.  Eyes:     General: No scleral icterus.    Extraocular Movements: Extraocular movements intact.  Cardiovascular:     Rate and Rhythm: Normal rate and regular rhythm.     Pulses: Normal pulses.     Heart sounds: Normal heart sounds. No murmur heard. Pulmonary:     Breath sounds: Normal breath sounds. No wheezing or rales.  Musculoskeletal:     Cervical back: Neck supple. No tenderness.     Right lower leg: No edema.     Left lower leg: No edema.  Skin:    General: Skin is warm.     Findings: No rash.  Neurological:     General: No focal deficit present.     Mental Status: She is alert and oriented to person, place, and time.     Sensory: No sensory deficit.     Motor: No weakness.  Psychiatric:        Mood and Affect: Mood normal.        Behavior: Behavior normal.     BP 117/78 (BP Location: Right Arm, Patient Position: Sitting, Cuff Size: Large)   Pulse 78   Ht 5\' 5"  (1.651 m)   Wt 252 lb 6.4 oz (114.5 kg)   SpO2 97%   BMI 42.00 kg/m  Wt Readings from Last 3 Encounters:  04/13/23 252 lb 6.4 oz (114.5 kg)  12/10/22 258 lb 9.6 oz (117.3 kg)  08/18/22  266 lb 1.3 oz (120.7 kg)    Lab Results  Component Value Date   TSH 3.800 12/27/2021   Lab Results  Component Value Date   WBC 10.3 12/27/2021   HGB 13.9 12/27/2021   HCT 42.3 12/27/2021   MCV 87 12/27/2021   PLT 346 12/27/2021   Lab Results  Component Value Date   NA 141 04/03/2023   K 4.7 04/03/2023  CO2 23 04/03/2023   GLUCOSE 136 (H) 04/03/2023   BUN 14 04/03/2023   CREATININE 0.67 04/03/2023   BILITOT 0.6 04/03/2023   ALKPHOS 78 04/03/2023   AST 12 04/03/2023   ALT 16 04/03/2023   PROT 7.2 04/03/2023   ALBUMIN 4.4 04/03/2023   CALCIUM 9.5 04/03/2023   ANIONGAP 11 02/20/2021   EGFR 106 04/03/2023   Lab Results  Component Value Date   CHOL 99 (L) 04/03/2023   Lab Results  Component Value Date   HDL 46 04/03/2023   Lab Results  Component Value Date   LDLCALC 39 04/03/2023   Lab Results  Component Value Date   TRIG 60 04/03/2023   Lab Results  Component Value Date   CHOLHDL 2.2 04/03/2023   Lab Results  Component Value Date   HGBA1C 6.7 (H) 04/03/2023      Assessment & Plan:   Problem List Items Addressed This Visit       Cardiovascular and Mediastinum   Essential hypertension    BP Readings from Last 1 Encounters:  04/13/23 117/78  Well-controlled with Lisinopril 2.5 mg QD and Metoprolol 50 mg BID Would prefer to increase dose of Lisinopril and DC Metoprolol slowly Increase dose of lisinopril to 5 mg QD and decreased metoprolol to 25 mg BID Counseled for compliance with the medications Advised DASH diet and moderate exercise/walking, at least 150 mins/week      Relevant Medications   lisinopril (ZESTRIL) 5 MG tablet     Endocrine   Type 2 diabetes mellitus with other specified complication (HCC)    Lab Results  Component Value Date   HGBA1C 6.7 (H) 04/03/2023   On metformin 1000 mg twice daily, glipizide 10 mg twice daily On Mounjaro 5 mg qw, had severe nausea with higher dose Advised to bring glucose meter and logs in the next  visit She has h/o hypoglycemia, had CGM, but had malfunction, prefers to do fingersticks Advised to follow diabetic diet On ACEi and statin Diabetic eye exam: Advised to follow up with Ophthalmology for diabetic eye exam      Relevant Medications   glipiZIDE (GLUCOTROL) 10 MG tablet   metFORMIN (GLUCOPHAGE) 1000 MG tablet   lisinopril (ZESTRIL) 5 MG tablet   Other Relevant Orders   Urine Microalbumin w/creat. ratio (Completed)     Other   Mixed hyperlipidemia    On Crestor Reviewed lipid profile      Relevant Medications   lisinopril (ZESTRIL) 5 MG tablet   Urgency incontinence    Well controlled with oxybutynin      Other Visit Diagnoses     Encounter for immunization    -  Primary   Relevant Orders   Varicella-zoster vaccine IM (Completed)       Meds ordered this encounter  Medications   glipiZIDE (GLUCOTROL) 10 MG tablet    Sig: Take 1 tablet (10 mg total) by mouth 2 (two) times daily.    Dispense:  180 tablet    Refill:  1   metFORMIN (GLUCOPHAGE) 1000 MG tablet    Sig: Take 1 tablet (1,000 mg total) by mouth 2 (two) times daily.    Dispense:  180 tablet    Refill:  1   lisinopril (ZESTRIL) 5 MG tablet    Sig: Take 1 tablet (5 mg total) by mouth daily.    Dispense:  90 tablet    Refill:  1    DOSE CHANGE    Follow-up: Return in about 4 months (around  08/14/2023) for Annual physical.    [name], MD

## 2023-04-13 NOTE — Assessment & Plan Note (Signed)
Lab Results  Component Value Date   HGBA1C 6.7 (H) 04/03/2023    On metformin 1000 mg twice daily, glipizide 10 mg twice daily On Mounjaro 5 mg qw, had severe nausea with higher dose Advised to bring glucose meter and logs in the next visit She has h/o hypoglycemia, had CGM, but had malfunction, prefers to do fingersticks Advised to follow diabetic diet On ACEi and statin Diabetic eye exam: Advised to follow up with Ophthalmology for diabetic eye exam

## 2023-04-13 NOTE — Assessment & Plan Note (Addendum)
BP Readings from Last 1 Encounters:  04/13/23 117/78   Well-controlled with Lisinopril 2.5 mg QD and Metoprolol 50 mg BID Would prefer to increase dose of Lisinopril and DC Metoprolol slowly Increase dose of lisinopril to 5 mg QD and decreased metoprolol to 25 mg BID Counseled for compliance with the medications Advised DASH diet and moderate exercise/walking, at least 150 mins/week

## 2023-04-13 NOTE — Patient Instructions (Addendum)
Please start taking Lisinopril 5 mg instead of 2.5 mg once daily and Metoprolol 25 mg twice daily instead of 50 mg.  Please use Compound W Fast acting liquid for foot callus.  Please continue to take medications as prescribed.  Please continue to follow low carb diet and perform moderate exercise/walking at least 150 mins/week.

## 2023-04-15 LAB — MICROALBUMIN / CREATININE URINE RATIO
Creatinine, Urine: 45.8 mg/dL
Microalb/Creat Ratio: 8 mg/g creat (ref 0–29)
Microalbumin, Urine: 3.5 ug/mL

## 2023-04-17 NOTE — Assessment & Plan Note (Signed)
Well controlled with oxybutynin °

## 2023-04-17 NOTE — Assessment & Plan Note (Signed)
On Crestor Reviewed lipid profile 

## 2023-04-20 ENCOUNTER — Other Ambulatory Visit: Payer: Self-pay | Admitting: Internal Medicine

## 2023-04-20 DIAGNOSIS — N3941 Urge incontinence: Secondary | ICD-10-CM

## 2023-04-20 DIAGNOSIS — N926 Irregular menstruation, unspecified: Secondary | ICD-10-CM

## 2023-04-21 ENCOUNTER — Telehealth: Payer: Self-pay | Admitting: Internal Medicine

## 2023-04-21 ENCOUNTER — Other Ambulatory Visit: Payer: Self-pay

## 2023-04-21 DIAGNOSIS — E1169 Type 2 diabetes mellitus with other specified complication: Secondary | ICD-10-CM

## 2023-04-21 NOTE — Telephone Encounter (Signed)
Referral placed , fwd to [facility]

## 2023-04-21 NOTE — Telephone Encounter (Signed)
Pt called and was wondering if we had a dietitian she can be referred to to help control her diet and diabetes.

## 2023-05-17 ENCOUNTER — Other Ambulatory Visit: Payer: Self-pay | Admitting: Internal Medicine

## 2023-05-17 DIAGNOSIS — E782 Mixed hyperlipidemia: Secondary | ICD-10-CM

## 2023-05-18 ENCOUNTER — Other Ambulatory Visit: Payer: Self-pay | Admitting: Family Medicine

## 2023-05-18 DIAGNOSIS — N926 Irregular menstruation, unspecified: Secondary | ICD-10-CM

## 2023-05-18 DIAGNOSIS — N3941 Urge incontinence: Secondary | ICD-10-CM

## 2023-06-06 ENCOUNTER — Other Ambulatory Visit: Payer: Self-pay | Admitting: Internal Medicine

## 2023-06-06 DIAGNOSIS — Z794 Long term (current) use of insulin: Secondary | ICD-10-CM

## 2023-06-16 ENCOUNTER — Other Ambulatory Visit: Payer: Self-pay | Admitting: Internal Medicine

## 2023-06-16 DIAGNOSIS — N926 Irregular menstruation, unspecified: Secondary | ICD-10-CM

## 2023-06-16 DIAGNOSIS — N3941 Urge incontinence: Secondary | ICD-10-CM

## 2023-07-01 ENCOUNTER — Other Ambulatory Visit: Payer: Self-pay | Admitting: Internal Medicine

## 2023-07-13 ENCOUNTER — Ambulatory Visit (INDEPENDENT_AMBULATORY_CARE_PROVIDER_SITE_OTHER): Payer: 59

## 2023-07-13 DIAGNOSIS — E1169 Type 2 diabetes mellitus with other specified complication: Secondary | ICD-10-CM | POA: Diagnosis not present

## 2023-07-13 LAB — HM DIABETES EYE EXAM

## 2023-07-13 NOTE — Progress Notes (Signed)
Tara Santiago arrived 07/13/2023 and has given verbal consent to obtain images and complete their overdue diabetic retinal screening.  The images have been sent to an ophthalmologist or optometrist for review and interpretation.  Results will be sent back to [name], MD for review.  Patient has been informed they will be contacted when we receive the results via telephone or MyChart

## 2023-07-20 ENCOUNTER — Other Ambulatory Visit: Payer: Self-pay | Admitting: Internal Medicine

## 2023-07-20 DIAGNOSIS — N3941 Urge incontinence: Secondary | ICD-10-CM

## 2023-07-20 DIAGNOSIS — N926 Irregular menstruation, unspecified: Secondary | ICD-10-CM

## 2023-08-17 ENCOUNTER — Encounter: Payer: Self-pay | Admitting: Internal Medicine

## 2023-08-17 ENCOUNTER — Ambulatory Visit: Payer: 59 | Admitting: Internal Medicine

## 2023-08-17 VITALS — BP 113/70 | HR 89 | Ht 65.0 in | Wt 248.0 lb

## 2023-08-17 DIAGNOSIS — I10 Essential (primary) hypertension: Secondary | ICD-10-CM

## 2023-08-17 DIAGNOSIS — N926 Irregular menstruation, unspecified: Secondary | ICD-10-CM

## 2023-08-17 DIAGNOSIS — E782 Mixed hyperlipidemia: Secondary | ICD-10-CM

## 2023-08-17 DIAGNOSIS — Z23 Encounter for immunization: Secondary | ICD-10-CM | POA: Diagnosis not present

## 2023-08-17 DIAGNOSIS — Z794 Long term (current) use of insulin: Secondary | ICD-10-CM

## 2023-08-17 DIAGNOSIS — Z0001 Encounter for general adult medical examination with abnormal findings: Secondary | ICD-10-CM | POA: Diagnosis not present

## 2023-08-17 DIAGNOSIS — N3941 Urge incontinence: Secondary | ICD-10-CM

## 2023-08-17 DIAGNOSIS — E1169 Type 2 diabetes mellitus with other specified complication: Secondary | ICD-10-CM

## 2023-08-17 DIAGNOSIS — E1142 Type 2 diabetes mellitus with diabetic polyneuropathy: Secondary | ICD-10-CM

## 2023-08-17 LAB — HEMOGLOBIN A1C: Hemoglobin A1C: 6

## 2023-08-17 MED ORDER — MOUNJARO 5 MG/0.5ML ~~LOC~~ SOAJ
5.0000 mg | SUBCUTANEOUS | 1 refills | Status: DC
Start: 2023-08-17 — End: 2024-03-31

## 2023-08-17 MED ORDER — ROSUVASTATIN CALCIUM 5 MG PO TABS
5.0000 mg | ORAL_TABLET | Freq: Every day | ORAL | 3 refills | Status: DC
Start: 2023-08-17 — End: 2024-08-12

## 2023-08-17 MED ORDER — OXYBUTYNIN CHLORIDE ER 10 MG PO TB24
10.0000 mg | ORAL_TABLET | Freq: Every day | ORAL | 3 refills | Status: DC
Start: 2023-08-17 — End: 2024-08-12

## 2023-08-17 MED ORDER — NORETHINDRONE 0.35 MG PO TABS
1.0000 | ORAL_TABLET | Freq: Every day | ORAL | 11 refills | Status: DC
Start: 2023-08-17 — End: 2024-08-31

## 2023-08-17 NOTE — Assessment & Plan Note (Signed)
BP Readings from Last 1 Encounters:  08/17/23 113/70   Well-controlled with Lisinopril 5 mg QD and Metoprolol 25 mg BID Due to tightly controlled BP, discontinue Metoprolol Counseled for compliance with the medications Advised DASH diet and moderate exercise/walking, at least 150 mins/week

## 2023-08-17 NOTE — Patient Instructions (Addendum)
Please stop taking Metoprolol. Continue taking Lisinopril 5 mg.  Please continue to take medications as prescribed.  Please continue to follow low carb diet and perform moderate exercise/walking at least 150 mins/week.  Please get fasting blood tests done before the next visit.

## 2023-08-17 NOTE — Progress Notes (Unsigned)
Established Patient Office Visit  Subjective:  Patient ID: Tara Santiago, female    DOB: November 03, 1971  Age: 52 y.o. MRN: [id number]  CC:  Chief Complaint  Patient presents with   Annual Exam    HPI Tara Santiago is a 52 y.o. female with past medical history of HTN, type II DM and urge incontinence who presents for annual physical.  HTN: BP is well-controlled. Takes medications regularly. Patient denies headache, dizziness, chest pain, dyspnea or palpitations.   Type II DM: She has been taking Mounjaro 5 mg qw. She continues to take metformin, glipizide and her blood glucose stays around 90-130 on many days. She denies any episode of hypoglycemia recently, but has had blood glucose in 60s in the past. She has lost about 6 lbs since the last visit.  She denies any polyuria or polydipsia currently.   She takes oxybutynin for urge incontinence.  She takes Micronor for irregular menstrual bleeding.  She denies any dysuria, hematuria or vaginal discharge currently.    Past Medical History:  Diagnosis Date   Diabetes mellitus without complication (HCC)    Hypertension    Thyroid disease     Past Surgical History:  Procedure Laterality Date   NO PAST SURGERIES     ORIF ANKLE FRACTURE Right 02/21/2021   Procedure: OPEN REDUCTION INTERNAL FIXATION (ORIF) ANKLE FRACTURE;  Surgeon: Oliver Barre, MD;  Location: [facility];  Service: Orthopedics;  Laterality: Right;    No family history on file.  Social History   Socioeconomic History   Marital status: Married    Spouse name: Not on file   Number of children: Not on file   Years of education: Not on file   Highest education level: Not on file  Occupational History   Not on file  Tobacco Use   Smoking status: Never   Smokeless tobacco: Never  Substance and Sexual Activity   Alcohol use: Never   Drug use: Never   Sexual activity: Not on file  Other Topics Concern   Not on file  Social History Narrative   Not on file    Social Determinants of Health   Financial Resource Strain: Not on file  Food Insecurity: Not on file  Transportation Needs: Not on file  Physical Activity: Not on file  Stress: Not on file  Social Connections: Not on file  Intimate Partner Violence: Not on file    Outpatient Medications Prior to Visit  Medication Sig Dispense Refill   APPLE CIDER VINEGAR PO Take 450 mg by mouth daily.     Cholecalciferol (VITAMIN D) 125 MCG (5000 UT) CAPS Take 5,000 Units by mouth daily.     clotrimazole (ANTIFUNGAL CLOTRIMAZOLE) 1 % cream Apply 1 Application topically 2 (two) times daily. 30 g 0   glipiZIDE (GLUCOTROL) 10 MG tablet Take 1 tablet (10 mg total) by mouth 2 (two) times daily. 180 tablet 1   lisinopril (ZESTRIL) 5 MG tablet Take 1 tablet (5 mg total) by mouth daily. 90 tablet 1   metFORMIN (GLUCOPHAGE) 1000 MG tablet Take 1 tablet (1,000 mg total) by mouth 2 (two) times daily. 180 tablet 1   metoprolol tartrate (LOPRESSOR) 50 MG tablet Take 1 tablet by mouth twice daily 90 tablet 0   MOUNJARO 5 MG/0.5ML Pen INJECT 5 MG  INTO THE SKIN ONCE A WEEK 12 mL 0   norethindrone (MICRONOR) 0.35 MG tablet Take 1 tablet by mouth once daily 28 tablet 0   ondansetron (ZOFRAN) 4  MG tablet Take 1 tablet (4 mg total) by mouth every 8 (eight) hours as needed for nausea or vomiting. 20 tablet 1   oxybutynin (DITROPAN-XL) 10 MG 24 hr tablet Take 1 tablet by mouth once daily 30 tablet 0   rosuvastatin (CRESTOR) 5 MG tablet Take 1 tablet by mouth once daily 90 tablet 0   No facility-administered medications prior to visit.    Allergies  Allergen Reactions   Peanut-Containing Drug Products Hives    Patient reports dizzy and passes out.    ROS Review of Systems    Objective:    Physical Exam  BP 113/70 (BP Location: Right Arm, Patient Position: Sitting, Cuff Size: Normal)   Pulse 89   Ht 5\' 5"  (1.651 m)   Wt 248 lb (112.5 kg)   SpO2 98%   BMI 41.27 kg/m  Wt Readings from Last 3 Encounters:   08/17/23 248 lb (112.5 kg)  04/13/23 252 lb 6.4 oz (114.5 kg)  12/10/22 258 lb 9.6 oz (117.3 kg)    Lab Results  Component Value Date   TSH 3.800 12/27/2021   Lab Results  Component Value Date   WBC 10.3 12/27/2021   HGB 13.9 12/27/2021   HCT 42.3 12/27/2021   MCV 87 12/27/2021   PLT 346 12/27/2021   Lab Results  Component Value Date   NA 141 04/03/2023   K 4.7 04/03/2023   CO2 23 04/03/2023   GLUCOSE 136 (H) 04/03/2023   BUN 14 04/03/2023   CREATININE 0.67 04/03/2023   BILITOT 0.6 04/03/2023   ALKPHOS 78 04/03/2023   AST 12 04/03/2023   ALT 16 04/03/2023   PROT 7.2 04/03/2023   ALBUMIN 4.4 04/03/2023   CALCIUM 9.5 04/03/2023   ANIONGAP 11 02/20/2021   EGFR 106 04/03/2023   Lab Results  Component Value Date   CHOL 99 (L) 04/03/2023   Lab Results  Component Value Date   HDL 46 04/03/2023   Lab Results  Component Value Date   LDLCALC 39 04/03/2023   Lab Results  Component Value Date   TRIG 60 04/03/2023   Lab Results  Component Value Date   CHOLHDL 2.2 04/03/2023   Lab Results  Component Value Date   HGBA1C 6.7 (H) 04/03/2023      Assessment & Plan:   Problem List Items Addressed This Visit   None   No orders of the defined types were placed in this encounter.   Follow-up: No follow-ups on file.    [name], MD

## 2023-08-18 ENCOUNTER — Telehealth: Payer: Self-pay | Admitting: Internal Medicine

## 2023-08-18 ENCOUNTER — Encounter: Payer: Self-pay | Admitting: Internal Medicine

## 2023-08-18 DIAGNOSIS — Z794 Long term (current) use of insulin: Secondary | ICD-10-CM | POA: Insufficient documentation

## 2023-08-18 LAB — BAYER DCA HB A1C WAIVED: HB A1C (BAYER DCA - WAIVED): 6 % — ABNORMAL HIGH (ref 4.8–5.6)

## 2023-08-18 NOTE — Telephone Encounter (Signed)
Spoke to patient

## 2023-08-18 NOTE — Assessment & Plan Note (Signed)
Has numbness in the feet, intermittent - likely due to diabetic neuropathy Has had weakness in legs as well Would benefit from custom molded shoes inserts and diabetic shoes

## 2023-08-18 NOTE — Assessment & Plan Note (Signed)
Lab Results  Component Value Date   HGBA1C 6.0 (H) 08/17/2023    On metformin 1000 mg twice daily, glipizide 10 mg twice daily On Mounjaro 5 mg qw, had severe nausea with higher dose Advised to bring glucose meter and logs in the next visit She has h/o hypoglycemia, had CGM, but had malfunction, prefers to do fingersticks Advised to follow diabetic diet On ACEi and statin Diabetic eye exam: Advised to follow up with Ophthalmology for diabetic eye exam

## 2023-08-18 NOTE — Assessment & Plan Note (Signed)
Well controlled with oxybutynin

## 2023-08-18 NOTE — Telephone Encounter (Signed)
Patient called about there results on A1C when resulted give her a call.

## 2023-08-18 NOTE — Assessment & Plan Note (Signed)
On Crestor Reviewed lipid profile

## 2023-08-18 NOTE — Assessment & Plan Note (Signed)
Takes Micronor daily, refilled

## 2023-08-18 NOTE — Assessment & Plan Note (Signed)

## 2023-08-19 ENCOUNTER — Telehealth: Payer: Self-pay | Admitting: Internal Medicine

## 2023-08-19 NOTE — Telephone Encounter (Signed)
[facility] D[facility] called they do not accept patient insurance to do the diabetic shoes.

## 2023-08-19 NOTE — Telephone Encounter (Signed)
Spoke to patient, she will call her insurance and let us know who to send the order to

## 2023-10-21 ENCOUNTER — Other Ambulatory Visit: Payer: Self-pay | Admitting: Internal Medicine

## 2023-10-21 DIAGNOSIS — I10 Essential (primary) hypertension: Secondary | ICD-10-CM

## 2023-11-13 ENCOUNTER — Other Ambulatory Visit: Payer: Self-pay | Admitting: Internal Medicine

## 2023-11-13 DIAGNOSIS — Z794 Long term (current) use of insulin: Secondary | ICD-10-CM

## 2023-11-16 ENCOUNTER — Other Ambulatory Visit (HOSPITAL_COMMUNITY): Payer: Self-pay | Admitting: Internal Medicine

## 2023-11-16 DIAGNOSIS — Z1231 Encounter for screening mammogram for malignant neoplasm of breast: Secondary | ICD-10-CM

## 2023-12-04 LAB — CMP14+EGFR
ALT: 18 [IU]/L (ref 0–32)
AST: 19 [IU]/L (ref 0–40)
Albumin: 4.4 g/dL (ref 3.8–4.9)
Alkaline Phosphatase: 95 [IU]/L (ref 44–121)
BUN/Creatinine Ratio: 17 (ref 9–23)
BUN: 11 mg/dL (ref 6–24)
Bilirubin Total: 0.5 mg/dL (ref 0.0–1.2)
CO2: 21 mmol/L (ref 20–29)
Calcium: 10 mg/dL (ref 8.7–10.2)
Chloride: 104 mmol/L (ref 96–106)
Creatinine, Ser: 0.63 mg/dL (ref 0.57–1.00)
Globulin, Total: 3.1 g/dL (ref 1.5–4.5)
Glucose: 114 mg/dL — ABNORMAL HIGH (ref 70–99)
Potassium: 5.3 mmol/L — ABNORMAL HIGH (ref 3.5–5.2)
Sodium: 140 mmol/L (ref 134–144)
Total Protein: 7.5 g/dL (ref 6.0–8.5)
eGFR: 107 mL/min/{1.73_m2} (ref >59)

## 2023-12-04 LAB — HEMOGLOBIN A1C
Est. average glucose Bld gHb Est-mCnc: 123 mg/dL
Hgb A1c MFr Bld: 5.9 % — ABNORMAL HIGH (ref 4.8–5.6)

## 2023-12-04 LAB — LIPID PANEL
Chol/HDL Ratio: 2.5 {ratio} (ref 0.0–4.4)
Cholesterol, Total: 98 mg/dL — ABNORMAL LOW (ref 100–199)
HDL: 39 mg/dL — ABNORMAL LOW (ref >39)
LDL Chol Calc (NIH): 44 mg/dL (ref 0–99)
Triglycerides: 70 mg/dL (ref 0–149)
VLDL Cholesterol Cal: 15 mg/dL (ref 5–40)

## 2023-12-17 ENCOUNTER — Telehealth: Payer: Self-pay

## 2023-12-17 NOTE — Telephone Encounter (Signed)
Unable to leave a vm

## 2023-12-17 NOTE — Telephone Encounter (Signed)
Copied from CRM [id number]. Topic: Clinical - Medication Question >> Dec 17, 2023 10:57 AM Ivette P wrote: Reason for CRM: Pt called in to get her A1 reading for her diabetes, requesting a call back [phone].

## 2023-12-18 ENCOUNTER — Ambulatory Visit: Payer: 59 | Admitting: Internal Medicine

## 2023-12-28 ENCOUNTER — Ambulatory Visit (HOSPITAL_COMMUNITY)
Admission: RE | Admit: 2023-12-28 | Discharge: 2023-12-28 | Disposition: A | Discharge status: Home / Self Care | Payer: 59 | Source: Ambulatory Visit | Attending: Internal Medicine | Admitting: Internal Medicine

## 2023-12-28 ENCOUNTER — Ambulatory Visit (HOSPITAL_COMMUNITY): Payer: 59

## 2023-12-28 DIAGNOSIS — Z1231 Encounter for screening mammogram for malignant neoplasm of breast: Secondary | ICD-10-CM | POA: Diagnosis present

## 2024-01-18 ENCOUNTER — Other Ambulatory Visit: Payer: Self-pay | Admitting: Internal Medicine

## 2024-01-18 DIAGNOSIS — I10 Essential (primary) hypertension: Secondary | ICD-10-CM

## 2024-02-02 ENCOUNTER — Ambulatory Visit: Payer: Self-pay | Admitting: Internal Medicine

## 2024-02-11 ENCOUNTER — Other Ambulatory Visit: Payer: Self-pay | Admitting: Internal Medicine

## 2024-02-11 DIAGNOSIS — E1169 Type 2 diabetes mellitus with other specified complication: Secondary | ICD-10-CM

## 2024-03-31 ENCOUNTER — Encounter: Payer: Self-pay | Admitting: Internal Medicine

## 2024-03-31 ENCOUNTER — Ambulatory Visit: Payer: Self-pay | Admitting: Internal Medicine

## 2024-03-31 VITALS — BP 101/70 | HR 105 | Ht 65.0 in | Wt 215.0 lb

## 2024-03-31 DIAGNOSIS — I10 Essential (primary) hypertension: Secondary | ICD-10-CM | POA: Diagnosis not present

## 2024-03-31 DIAGNOSIS — E782 Mixed hyperlipidemia: Secondary | ICD-10-CM | POA: Diagnosis not present

## 2024-03-31 DIAGNOSIS — N3941 Urge incontinence: Secondary | ICD-10-CM

## 2024-03-31 DIAGNOSIS — Z23 Encounter for immunization: Secondary | ICD-10-CM | POA: Diagnosis not present

## 2024-03-31 DIAGNOSIS — E1169 Type 2 diabetes mellitus with other specified complication: Secondary | ICD-10-CM | POA: Diagnosis not present

## 2024-03-31 DIAGNOSIS — Z7984 Long term (current) use of oral hypoglycemic drugs: Secondary | ICD-10-CM

## 2024-03-31 DIAGNOSIS — N926 Irregular menstruation, unspecified: Secondary | ICD-10-CM

## 2024-03-31 MED ORDER — METFORMIN HCL 1000 MG PO TABS: 1000.0000 mg | ORAL_TABLET | Freq: Two times a day (BID) | ORAL | 1 refills | Status: DC

## 2024-03-31 MED ORDER — LISINOPRIL 5 MG PO TABS: 5.0000 mg | ORAL_TABLET | Freq: Every day | ORAL | 3 refills | Status: AC

## 2024-03-31 MED ORDER — GLIPIZIDE 5 MG PO TABS: 5.0000 mg | ORAL_TABLET | Freq: Two times a day (BID) | ORAL | 1 refills | Status: DC

## 2024-03-31 NOTE — Assessment & Plan Note (Signed)
 Well controlled with oxybutynin  10 mg QD

## 2024-03-31 NOTE — Assessment & Plan Note (Signed)
 BP Readings from Last 1 Encounters:  03/31/24 101/70   Well-controlled with Lisinopril  5 mg QD and Metoprolol  25 mg BID Due to tightly controlled BP, discontinue Metoprolol  Counseled for compliance with the medications Advised DASH diet and moderate exercise/walking, at least 150 mins/week

## 2024-03-31 NOTE — Assessment & Plan Note (Addendum)
 Lab Results  Component Value Date   HGBA1C 5.9 (H) 12/03/2023   Well-controlled Associated with HTN and HLD On metformin  1000 mg twice daily, glipizide  10 mg twice daily Was on Mounjaro  5 mg qw, had severe nausea with higher dose -has discontinued Mounjaro , has still lost 33 lbs since the last visit with low-carb diet Decreased dose of glipizide  to 5 mg BID Advised to bring glucose meter and logs in the next visit She has h/o hypoglycemia, had CGM, but had malfunction, prefers to do fingersticks Advised to follow diabetic diet On ACEi and statin Diabetic eye exam: Advised to follow up with Ophthalmology for diabetic eye exam

## 2024-03-31 NOTE — Assessment & Plan Note (Signed)
 Takes Micronor  daily -advised to taper and discontinue as she should not have menstrual bleeding now

## 2024-03-31 NOTE — Patient Instructions (Signed)
 Please start taking Glipizide  5 mg instead of 10 mg.  Please continue to take medications as prescribed.  Please continue to follow low carb diet and perform moderate exercise/walking at least 150 mins/week.

## 2024-03-31 NOTE — Progress Notes (Signed)
 Established Patient Office Visit  Subjective:  Patient ID: Tara Santiago, female    DOB: 02-25-1971  Age: 53 y.o. MRN: [id number]  CC:  Chief Complaint  Patient presents with   Medical Management of Chronic Issues    Follow up for diabetes.    HPI Tara Santiago is a 53 y.o. female with past medical history of HTN, type II DM and urge incontinence who presents for f/u of her chronic medical conditions.  HTN: BP is well-controlled. Takes medications regularly. Patient denies headache, dizziness, chest pain, dyspnea or palpitations.   Type II DM: She has stopped taking Mounjaro  5 mg qw since 11/24. She continues to take metformin  1000 mg twice daily, glipizide  10 mg twice daily and her blood glucose stays around 70-90 mostly in the morning. She has had episodes of hypoglycemia recently, blood glucose in 60s. She has lost about 33 lbs since the last visit and a total of 89 lbs since 01/23.  She denies any polyuria or polydipsia currently.  She takes oxybutynin  for urge incontinence.  She takes Micronor  for irregular menstrual bleeding, but does not have any vaginal bleeding now.  She denies any dysuria, hematuria or vaginal discharge currently.    Past Medical History:  Diagnosis Date   Diabetes mellitus without complication (HCC)    Hypertension    Thyroid disease     Past Surgical History:  Procedure Laterality Date   NO PAST SURGERIES     ORIF ANKLE FRACTURE Right 02/21/2021   Procedure: OPEN REDUCTION INTERNAL FIXATION (ORIF) ANKLE FRACTURE;  Surgeon: Tonita Frater, MD;  Location: [facility];  Service: Orthopedics;  Laterality: Right;    History reviewed. No pertinent family history.  Social History   Socioeconomic History   Marital status: Married    Spouse name: Not on file   Number of children: Not on file   Years of education: Not on file   Highest education level: Not on file  Occupational History   Not on file  Tobacco Use   Smoking status: Never    Smokeless tobacco: Never  Substance and Sexual Activity   Alcohol use: Never   Drug use: Never   Sexual activity: Not on file  Other Topics Concern   Not on file  Social History Narrative   Not on file   Social Drivers of Health   Financial Resource Strain: Not on file  Food Insecurity: Not on file  Transportation Needs: Not on file  Physical Activity: Not on file  Stress: Not on file  Social Connections: Not on file  Intimate Partner Violence: Not on file    Outpatient Medications Prior to Visit  Medication Sig Dispense Refill   APPLE CIDER VINEGAR PO Take 450 mg by mouth daily.     Cholecalciferol (VITAMIN D) 125 MCG (5000 UT) CAPS Take 5,000 Units by mouth daily.     clotrimazole  (ANTIFUNGAL CLOTRIMAZOLE ) 1 % cream Apply 1 Application topically 2 (two) times daily. 30 g 0   norethindrone  (MICRONOR ) 0.35 MG tablet Take 1 tablet (0.35 mg total) by mouth daily. 28 tablet 11   ondansetron  (ZOFRAN ) 4 MG tablet Take 1 tablet (4 mg total) by mouth every 8 (eight) hours as needed for nausea or vomiting. 20 tablet 1   oxybutynin  (DITROPAN -XL) 10 MG 24 hr tablet Take 1 tablet (10 mg total) by mouth daily. 90 tablet 3   rosuvastatin  (CRESTOR ) 5 MG tablet Take 1 tablet (5 mg total) by mouth daily. 90 tablet 3  glipiZIDE  (GLUCOTROL ) 10 MG tablet Take 1 tablet by mouth twice daily 180 tablet 0   lisinopril  (ZESTRIL ) 5 MG tablet Take 1 tablet by mouth once daily 90 tablet 0   metFORMIN  (GLUCOPHAGE ) 1000 MG tablet Take 1 tablet by mouth twice daily 180 tablet 0   tirzepatide  (MOUNJARO ) 5 MG/0.5ML Pen Inject 5 mg into the skin once a week. (Patient not taking: Reported on 03/31/2024) 12 mL 1   No facility-administered medications prior to visit.    Allergies  Allergen Reactions   Peanut-Containing Drug Products Hives    Patient reports dizzy and passes out.    ROS Review of Systems  Constitutional:  Negative for chills and fever.  HENT:  Negative for congestion, sinus pressure, sinus  pain and sore throat.   Eyes:  Negative for pain and discharge.  Respiratory:  Negative for cough and shortness of breath.   Cardiovascular:  Negative for chest pain and palpitations.  Gastrointestinal:  Negative for abdominal pain, constipation, diarrhea, nausea and vomiting.  Endocrine: Negative for polydipsia and polyuria.  Genitourinary:  Negative for dysuria and hematuria.  Musculoskeletal:  Negative for neck pain and neck stiffness.  Skin:  Negative for rash.  Neurological:  Positive for numbness (B/l feet). Negative for dizziness and weakness.  Psychiatric/Behavioral:  Negative for agitation and behavioral problems.       Objective:    Physical Exam Vitals reviewed.  Constitutional:      General: She is not in acute distress.    Appearance: She is obese. She is not diaphoretic.  HENT:     Head: Normocephalic and atraumatic.     Nose: Nose normal.     Mouth/Throat:     Mouth: Mucous membranes are moist.  Eyes:     General: No scleral icterus.    Extraocular Movements: Extraocular movements intact.  Cardiovascular:     Rate and Rhythm: Normal rate and regular rhythm.     Pulses: Normal pulses.     Heart sounds: Normal heart sounds. No murmur heard. Pulmonary:     Breath sounds: Normal breath sounds. No wheezing or rales.  Abdominal:     Palpations: Abdomen is soft.     Tenderness: There is no abdominal tenderness.  Musculoskeletal:     Cervical back: Neck supple. No tenderness.     Right lower leg: No edema.     Left lower leg: No edema.  Skin:    General: Skin is warm.     Findings: No rash.  Neurological:     General: No focal deficit present.     Mental Status: She is alert and oriented to person, place, and time.     Sensory: Sensory deficit (B/l feet) present.     Motor: Weakness (B/l LE - 4/5) present.  Psychiatric:        Mood and Affect: Mood normal.        Behavior: Behavior normal.     BP 101/70   Pulse (!) 105   Ht 5\' 5"  (1.651 m)   Wt 215 lb  (97.5 kg)   SpO2 97%   BMI 35.78 kg/m  Wt Readings from Last 3 Encounters:  03/31/24 215 lb (97.5 kg)  08/17/23 248 lb (112.5 kg)  04/13/23 252 lb 6.4 oz (114.5 kg)    Lab Results  Component Value Date   TSH 3.800 12/27/2021   Lab Results  Component Value Date   WBC 10.3 12/27/2021   HGB 13.9 12/27/2021   HCT 42.3 12/27/2021  MCV 87 12/27/2021   PLT 346 12/27/2021   Lab Results  Component Value Date   NA 140 12/03/2023   K 5.3 (H) 12/03/2023   CO2 21 12/03/2023   GLUCOSE 114 (H) 12/03/2023   BUN 11 12/03/2023   CREATININE 0.63 12/03/2023   BILITOT 0.5 12/03/2023   ALKPHOS 95 12/03/2023   AST 19 12/03/2023   ALT 18 12/03/2023   PROT 7.5 12/03/2023   ALBUMIN 4.4 12/03/2023   CALCIUM  10.0 12/03/2023   ANIONGAP 11 02/20/2021   EGFR 107 12/03/2023   Lab Results  Component Value Date   CHOL 98 (L) 12/03/2023   Lab Results  Component Value Date   HDL 39 (L) 12/03/2023   Lab Results  Component Value Date   LDLCALC 44 12/03/2023   Lab Results  Component Value Date   TRIG 70 12/03/2023   Lab Results  Component Value Date   CHOLHDL 2.5 12/03/2023   Lab Results  Component Value Date   HGBA1C 5.9 (H) 12/03/2023      Assessment & Plan:   Problem List Items Addressed This Visit       Cardiovascular and Mediastinum   Essential hypertension   BP Readings from Last 1 Encounters:  03/31/24 101/70   Well-controlled with Lisinopril  5 mg QD and Metoprolol  25 mg BID Due to tightly controlled BP, discontinue Metoprolol  Counseled for compliance with the medications Advised DASH diet and moderate exercise/walking, at least 150 mins/week      Relevant Medications   lisinopril  (ZESTRIL ) 5 MG tablet     Endocrine   Type 2 diabetes mellitus with other specified complication (HCC) - Primary   Lab Results  Component Value Date   HGBA1C 5.9 (H) 12/03/2023   Well-controlled Associated with HTN and HLD On metformin  1000 mg twice daily, glipizide  10 mg twice  daily Was on Mounjaro  5 mg qw, had severe nausea with higher dose -has discontinued Mounjaro , has still lost 33 lbs since the last visit with low-carb diet Decreased dose of glipizide  to 5 mg BID Advised to bring glucose meter and logs in the next visit She has h/o hypoglycemia, had CGM, but had malfunction, prefers to do fingersticks Advised to follow diabetic diet On ACEi and statin Diabetic eye exam: Advised to follow up with Ophthalmology for diabetic eye exam      Relevant Medications   glipiZIDE  (GLUCOTROL ) 5 MG tablet   metFORMIN  (GLUCOPHAGE ) 1000 MG tablet   lisinopril  (ZESTRIL ) 5 MG tablet   Other Relevant Orders   Microalbumin / creatinine urine ratio   CMP14+EGFR   Hemoglobin A1c     Other   Irregular menstrual bleeding   Takes Micronor  daily -advised to taper and discontinue as she should not have menstrual bleeding now      Mixed hyperlipidemia   On Crestor  Reviewed lipid profile      Relevant Medications   lisinopril  (ZESTRIL ) 5 MG tablet   Urgency incontinence   Well controlled with oxybutynin  10 mg QD      Other Visit Diagnoses       Encounter for immunization       Relevant Orders   Pneumococcal conjugate vaccine 20-valent (Completed)        Meds ordered this encounter  Medications   glipiZIDE  (GLUCOTROL ) 5 MG tablet    Sig: Take 1 tablet (5 mg total) by mouth 2 (two) times daily.    Dispense:  180 tablet    Refill:  1   metFORMIN  (GLUCOPHAGE ) 1000 MG  tablet    Sig: Take 1 tablet (1,000 mg total) by mouth 2 (two) times daily.    Dispense:  180 tablet    Refill:  1   lisinopril  (ZESTRIL ) 5 MG tablet    Sig: Take 1 tablet (5 mg total) by mouth daily.    Dispense:  90 tablet    Refill:  3    Follow-up: Return in about 5 months (around 08/31/2024).    Meldon Sport, MD

## 2024-03-31 NOTE — Assessment & Plan Note (Signed)
On Crestor Reviewed lipid profile

## 2024-04-01 LAB — HEMOGLOBIN A1C
Est. average glucose Bld gHb Est-mCnc: 128 mg/dL
Hgb A1c MFr Bld: 6.1 % — ABNORMAL HIGH (ref 4.8–5.6)

## 2024-04-01 LAB — CMP14+EGFR
ALT: 20 IU/L (ref 0–32)
AST: 17 IU/L (ref 0–40)
Albumin: 4.7 g/dL (ref 3.8–4.9)
Alkaline Phosphatase: 94 IU/L (ref 44–121)
BUN/Creatinine Ratio: 14 (ref 9–23)
BUN: 9 mg/dL (ref 6–24)
Bilirubin Total: 0.4 mg/dL (ref 0.0–1.2)
CO2: 22 mmol/L (ref 20–29)
Calcium: 9.9 mg/dL (ref 8.7–10.2)
Chloride: 104 mmol/L (ref 96–106)
Creatinine, Ser: 0.66 mg/dL (ref 0.57–1.00)
Globulin, Total: 3.2 g/dL (ref 1.5–4.5)
Glucose: 69 mg/dL — ABNORMAL LOW (ref 70–99)
Potassium: 4.4 mmol/L (ref 3.5–5.2)
Sodium: 142 mmol/L (ref 134–144)
Total Protein: 7.9 g/dL (ref 6.0–8.5)
eGFR: 106 mL/min/{1.73_m2} (ref >59)

## 2024-04-02 LAB — MICROALBUMIN / CREATININE URINE RATIO
Creatinine, Urine: 42.9 mg/dL
Microalb/Creat Ratio: <7 mg/g{creat} (ref 0–29)
Microalbumin, Urine: <3 ug/mL

## 2024-07-19 ENCOUNTER — Ambulatory Visit: Payer: Self-pay | Admitting: Internal Medicine

## 2024-08-12 ENCOUNTER — Other Ambulatory Visit: Payer: Self-pay | Admitting: Internal Medicine

## 2024-08-12 DIAGNOSIS — N3941 Urge incontinence: Secondary | ICD-10-CM

## 2024-08-12 DIAGNOSIS — E782 Mixed hyperlipidemia: Secondary | ICD-10-CM

## 2024-08-31 ENCOUNTER — Encounter: Payer: Self-pay | Admitting: Internal Medicine

## 2024-08-31 ENCOUNTER — Ambulatory Visit: Admitting: Internal Medicine

## 2024-08-31 VITALS — BP 108/76 | HR 98 | Ht 65.0 in | Wt 221.6 lb

## 2024-08-31 DIAGNOSIS — N3941 Urge incontinence: Secondary | ICD-10-CM

## 2024-08-31 DIAGNOSIS — Z23 Encounter for immunization: Secondary | ICD-10-CM | POA: Diagnosis not present

## 2024-08-31 DIAGNOSIS — I10 Essential (primary) hypertension: Secondary | ICD-10-CM | POA: Diagnosis not present

## 2024-08-31 DIAGNOSIS — E782 Mixed hyperlipidemia: Secondary | ICD-10-CM

## 2024-08-31 DIAGNOSIS — Z0001 Encounter for general adult medical examination with abnormal findings: Secondary | ICD-10-CM | POA: Diagnosis not present

## 2024-08-31 DIAGNOSIS — E559 Vitamin D deficiency, unspecified: Secondary | ICD-10-CM

## 2024-08-31 DIAGNOSIS — E1169 Type 2 diabetes mellitus with other specified complication: Secondary | ICD-10-CM | POA: Diagnosis not present

## 2024-08-31 DIAGNOSIS — R7989 Other specified abnormal findings of blood chemistry: Secondary | ICD-10-CM

## 2024-08-31 DIAGNOSIS — Z7984 Long term (current) use of oral hypoglycemic drugs: Secondary | ICD-10-CM

## 2024-08-31 NOTE — Progress Notes (Unsigned)
 Established Patient Office Visit  Subjective:  Patient ID: Tara Santiago, female    DOB: Nov 01, 1971  Age: 53 y.o. MRN: [id number]  CC:  Chief Complaint  Patient presents with   Medical Management of Chronic Issues    HPI Tara Santiago is a 53 y.o. female with past medical history of HTN, type II DM and urge incontinence who presents for f/u of her chronic medical conditions.  HTN: BP is well-controlled. Takes medications regularly. Patient denies headache, dizziness, chest pain, dyspnea or palpitations.   Type II DM: She has stopped taking Mounjaro  5 mg qw since 11/24. She continues to take metformin  1000 mg twice daily, glipizide  10 mg twice daily and her blood glucose stays around 70-90 mostly in the morning. She has had episodes of hypoglycemia recently, blood glucose in 60s. She has lost about 33 lbs since the last visit and a total of 89 lbs since 01/23.  She denies any polyuria or polydipsia currently.  She takes oxybutynin  for urge incontinence.  She takes Micronor  for irregular menstrual bleeding, but does not have any vaginal bleeding now.  She denies any dysuria, hematuria or vaginal discharge currently.    Past Medical History:  Diagnosis Date   Diabetes mellitus without complication (HCC)    Hypertension    Thyroid disease     Past Surgical History:  Procedure Laterality Date   NO PAST SURGERIES     ORIF ANKLE FRACTURE Right 02/21/2021   Procedure: OPEN REDUCTION INTERNAL FIXATION (ORIF) ANKLE FRACTURE;  Surgeon: Onesimo Oneil DELENA, MD;  Location: [facility];  Service: Orthopedics;  Laterality: Right;    History reviewed. No pertinent family history.  Social History   Socioeconomic History   Marital status: Married    Spouse name: Not on file   Number of children: Not on file   Years of education: Not on file   Highest education level: Not on file  Occupational History   Not on file  Tobacco Use   Smoking status: Never   Smokeless tobacco: Never  Substance  and Sexual Activity   Alcohol use: Never   Drug use: Never   Sexual activity: Not on file  Other Topics Concern   Not on file  Social History Narrative   Not on file   Social Drivers of Health   Financial Resource Strain: Not on file  Food Insecurity: Not on file  Transportation Needs: Not on file  Physical Activity: Not on file  Stress: Not on file  Social Connections: Not on file  Intimate Partner Violence: Not on file    Outpatient Medications Prior to Visit  Medication Sig Dispense Refill   APPLE CIDER VINEGAR PO Take 450 mg by mouth daily.     Cholecalciferol (VITAMIN D) 125 MCG (5000 UT) CAPS Take 5,000 Units by mouth daily.     clotrimazole  (ANTIFUNGAL CLOTRIMAZOLE ) 1 % cream Apply 1 Application topically 2 (two) times daily. 30 g 0   glipiZIDE  (GLUCOTROL ) 5 MG tablet Take 1 tablet (5 mg total) by mouth 2 (two) times daily. 180 tablet 1   lisinopril  (ZESTRIL ) 5 MG tablet Take 1 tablet (5 mg total) by mouth daily. 90 tablet 3   metFORMIN  (GLUCOPHAGE ) 1000 MG tablet Take 1 tablet (1,000 mg total) by mouth 2 (two) times daily. 180 tablet 1   norethindrone  (MICRONOR ) 0.35 MG tablet Take 1 tablet (0.35 mg total) by mouth daily. 28 tablet 11   ondansetron  (ZOFRAN ) 4 MG tablet Take 1 tablet (4 mg  total) by mouth every 8 (eight) hours as needed for nausea or vomiting. 20 tablet 1   oxybutynin  (DITROPAN -XL) 10 MG 24 hr tablet Take 1 tablet by mouth once daily 90 tablet 0   rosuvastatin  (CRESTOR ) 5 MG tablet Take 1 tablet by mouth once daily 90 tablet 0   No facility-administered medications prior to visit.    Allergies  Allergen Reactions   Peanut-Containing Drug Products Hives    Patient reports dizzy and passes out.    ROS Review of Systems  Constitutional:  Negative for chills and fever.  HENT:  Negative for congestion, sinus pressure, sinus pain and sore throat.   Eyes:  Negative for pain and discharge.  Respiratory:  Negative for cough and shortness of breath.    Cardiovascular:  Negative for chest pain and palpitations.  Gastrointestinal:  Negative for abdominal pain, constipation, diarrhea, nausea and vomiting.  Endocrine: Negative for polydipsia and polyuria.  Genitourinary:  Negative for dysuria and hematuria.  Musculoskeletal:  Negative for neck pain and neck stiffness.  Skin:  Negative for rash.  Neurological:  Positive for numbness (B/l feet). Negative for dizziness and weakness.  Psychiatric/Behavioral:  Negative for agitation and behavioral problems.       Objective:    Physical Exam Vitals reviewed.  Constitutional:      General: She is not in acute distress.    Appearance: She is obese. She is not diaphoretic.  HENT:     Head: Normocephalic and atraumatic.     Nose: Nose normal.     Mouth/Throat:     Mouth: Mucous membranes are moist.  Eyes:     General: No scleral icterus.    Extraocular Movements: Extraocular movements intact.  Cardiovascular:     Rate and Rhythm: Normal rate and regular rhythm.     Pulses: Normal pulses.     Heart sounds: Normal heart sounds. No murmur heard. Pulmonary:     Breath sounds: Normal breath sounds. No wheezing or rales.  Abdominal:     Palpations: Abdomen is soft.     Tenderness: There is no abdominal tenderness.  Musculoskeletal:     Cervical back: Neck supple. No tenderness.     Right lower leg: No edema.     Left lower leg: No edema.  Skin:    General: Skin is warm.     Findings: No rash.  Neurological:     General: No focal deficit present.     Mental Status: She is alert and oriented to person, place, and time.     Sensory: Sensory deficit (B/l feet) present.     Motor: Weakness (B/l LE - 4/5) present.  Psychiatric:        Mood and Affect: Mood normal.        Behavior: Behavior normal.     BP 108/76   Pulse 98   Ht 5' 5 (1.651 m)   Wt 221 lb 9.6 oz (100.5 kg)   SpO2 98%   BMI 36.88 kg/m  Wt Readings from Last 3 Encounters:  08/31/24 221 lb 9.6 oz (100.5 kg)   03/31/24 215 lb (97.5 kg)  08/17/23 248 lb (112.5 kg)    Lab Results  Component Value Date   TSH 3.800 12/27/2021   Lab Results  Component Value Date   WBC 10.3 12/27/2021   HGB 13.9 12/27/2021   HCT 42.3 12/27/2021   MCV 87 12/27/2021   PLT 346 12/27/2021   Lab Results  Component Value Date   NA 142  03/31/2024   K 4.4 03/31/2024   CO2 22 03/31/2024   GLUCOSE 69 (L) 03/31/2024   BUN 9 03/31/2024   CREATININE 0.66 03/31/2024   BILITOT 0.4 03/31/2024   ALKPHOS 94 03/31/2024   AST 17 03/31/2024   ALT 20 03/31/2024   PROT 7.9 03/31/2024   ALBUMIN 4.7 03/31/2024   CALCIUM  9.9 03/31/2024   ANIONGAP 11 02/20/2021   EGFR 106 03/31/2024   Lab Results  Component Value Date   CHOL 98 (L) 12/03/2023   Lab Results  Component Value Date   HDL 39 (L) 12/03/2023   Lab Results  Component Value Date   LDLCALC 44 12/03/2023   Lab Results  Component Value Date   TRIG 70 12/03/2023   Lab Results  Component Value Date   CHOLHDL 2.5 12/03/2023   Lab Results  Component Value Date   HGBA1C 6.1 (H) 03/31/2024      Assessment & Plan:   Problem List Items Addressed This Visit   None     No orders of the defined types were placed in this encounter.   Follow-up: No follow-ups on file.    Suzzane MARLA Blanch, MD

## 2024-08-31 NOTE — Assessment & Plan Note (Signed)
On Crestor Check lipid profile 

## 2024-08-31 NOTE — Assessment & Plan Note (Signed)
 Lab Results  Component Value Date   HGBA1C 6.1 (H) 03/31/2024   Well-controlled Associated with HTN and HLD On metformin  1000 mg twice daily, glipizide  5 mg twice daily Was on Mounjaro  5 mg qw, had severe nausea with higher dose -has discontinued Mounjaro , has still lost 27 lbs since stopping Mounjaro   Advised to bring glucose meter and logs in the next visit She has h/o hypoglycemia, had CGM, but had malfunction, prefers to do fingersticks Advised to follow diabetic diet On ACEi and statin Diabetic eye exam: Advised to follow up with Ophthalmology for diabetic eye exam

## 2024-08-31 NOTE — Assessment & Plan Note (Signed)
 BP Readings from Last 1 Encounters:  08/31/24 108/76   Well-controlled with Lisinopril  5 mg QD Counseled for compliance with the medications Advised DASH diet and moderate exercise/walking, at least 150 mins/week

## 2024-08-31 NOTE — Patient Instructions (Signed)
 Please continue to take medications as prescribed.  Please continue to follow low carb diet and perform moderate exercise/walking at least 150 mins/week.

## 2024-09-01 ENCOUNTER — Ambulatory Visit: Payer: Self-pay | Admitting: Internal Medicine

## 2024-09-01 ENCOUNTER — Other Ambulatory Visit: Payer: Self-pay | Admitting: Internal Medicine

## 2024-09-01 DIAGNOSIS — E0590 Thyrotoxicosis, unspecified without thyrotoxic crisis or storm: Secondary | ICD-10-CM

## 2024-09-01 LAB — CBC WITH DIFFERENTIAL/PLATELET
Basophils Absolute: 0.1 x10E3/uL (ref 0.0–0.2)
Basos: 1 %
EOS (ABSOLUTE): 0.2 x10E3/uL (ref 0.0–0.4)
Eos: 2 %
Hematocrit: 40 % (ref 34.0–46.6)
Hemoglobin: 13.4 g/dL (ref 11.1–15.9)
Immature Grans (Abs): 0 x10E3/uL (ref 0.0–0.1)
Immature Granulocytes: 0 %
Lymphocytes Absolute: 2.5 x10E3/uL (ref 0.7–3.1)
Lymphs: 24 %
MCH: 30.6 pg (ref 26.6–33.0)
MCHC: 33.5 g/dL (ref 31.5–35.7)
MCV: 91 fL (ref 79–97)
Monocytes Absolute: 0.6 x10E3/uL (ref 0.1–0.9)
Monocytes: 6 %
Neutrophils Absolute: 7 x10E3/uL (ref 1.4–7.0)
Neutrophils: 67 %
Platelets: 351 x10E3/uL (ref 150–450)
RBC: 4.38 x10E6/uL (ref 3.77–5.28)
RDW: 13.1 % (ref 11.7–15.4)
WBC: 10.5 x10E3/uL (ref 3.4–10.8)

## 2024-09-01 LAB — CMP14+EGFR
ALT: 20 IU/L (ref 0–32)
AST: 19 IU/L (ref 0–40)
Albumin: 4.6 g/dL (ref 3.8–4.9)
Alkaline Phosphatase: 79 IU/L (ref 49–135)
BUN/Creatinine Ratio: 19 (ref 9–23)
BUN: 13 mg/dL (ref 6–24)
Bilirubin Total: 0.3 mg/dL (ref 0.0–1.2)
CO2: 22 mmol/L (ref 20–29)
Calcium: 9.6 mg/dL (ref 8.7–10.2)
Chloride: 101 mmol/L (ref 96–106)
Creatinine, Ser: 0.7 mg/dL (ref 0.57–1.00)
Globulin, Total: 3.1 g/dL (ref 1.5–4.5)
Glucose: 78 mg/dL (ref 70–99)
Potassium: 4.4 mmol/L (ref 3.5–5.2)
Sodium: 136 mmol/L (ref 134–144)
Total Protein: 7.7 g/dL (ref 6.0–8.5)
eGFR: 105 mL/min/1.73 (ref >59)

## 2024-09-01 LAB — LIPID PANEL
Chol/HDL Ratio: 2 ratio (ref 0.0–4.4)
Cholesterol, Total: 107 mg/dL (ref 100–199)
HDL: 53 mg/dL (ref >39)
LDL Chol Calc (NIH): 40 mg/dL (ref 0–99)
Triglycerides: 62 mg/dL (ref 0–149)
VLDL Cholesterol Cal: 14 mg/dL (ref 5–40)

## 2024-09-01 LAB — TSH: TSH: 0.093 u[IU]/mL — ABNORMAL LOW (ref 0.450–4.500)

## 2024-09-01 LAB — HEMOGLOBIN A1C
Est. average glucose Bld gHb Est-mCnc: 137 mg/dL
Hgb A1c MFr Bld: 6.4 % — ABNORMAL HIGH (ref 4.8–5.6)

## 2024-09-01 LAB — VITAMIN D 25 HYDROXY (VIT D DEFICIENCY, FRACTURES): Vit D, 25-Hydroxy: 62 ng/mL (ref 30.0–100.0)

## 2024-09-02 LAB — T4, FREE: Free T4: 1.6 ng/dL (ref 0.82–1.77)

## 2024-09-02 LAB — SPECIMEN STATUS REPORT

## 2024-09-02 NOTE — Assessment & Plan Note (Signed)
 Well controlled with oxybutynin  10 mg QD

## 2024-09-02 NOTE — Assessment & Plan Note (Signed)

## 2024-09-28 ENCOUNTER — Other Ambulatory Visit: Payer: Self-pay | Admitting: Internal Medicine

## 2024-09-28 DIAGNOSIS — E1169 Type 2 diabetes mellitus with other specified complication: Secondary | ICD-10-CM

## 2024-11-11 ENCOUNTER — Other Ambulatory Visit: Payer: Self-pay | Admitting: Internal Medicine

## 2024-11-11 DIAGNOSIS — E1169 Type 2 diabetes mellitus with other specified complication: Secondary | ICD-10-CM

## 2024-11-11 DIAGNOSIS — E782 Mixed hyperlipidemia: Secondary | ICD-10-CM

## 2024-11-11 DIAGNOSIS — N3941 Urge incontinence: Secondary | ICD-10-CM

## 2024-11-21 ENCOUNTER — Other Ambulatory Visit (HOSPITAL_COMMUNITY): Payer: Self-pay | Admitting: Internal Medicine

## 2024-11-21 DIAGNOSIS — Z1231 Encounter for screening mammogram for malignant neoplasm of breast: Secondary | ICD-10-CM

## 2025-01-02 ENCOUNTER — Ambulatory Visit (HOSPITAL_COMMUNITY)

## 2025-01-03 ENCOUNTER — Other Ambulatory Visit: Payer: Self-pay | Admitting: Internal Medicine

## 2025-01-03 DIAGNOSIS — E1169 Type 2 diabetes mellitus with other specified complication: Secondary | ICD-10-CM

## 2025-01-09 ENCOUNTER — Ambulatory Visit (HOSPITAL_COMMUNITY)

## 2025-03-01 ENCOUNTER — Ambulatory Visit: Admitting: Internal Medicine
